# Patient Record
Sex: Female | Born: 1985 | Race: Black or African American | Hispanic: No | Marital: Single | Smoking: Never smoker
Health system: Southern US, Community
[De-identification: ages and names within clinical notes are randomized; demographics above are authoritative.]

## PROBLEM LIST (undated history)

## (undated) DIAGNOSIS — Z789 Other specified health status: Secondary | ICD-10-CM

## (undated) HISTORY — PX: NO PAST SURGERIES: SHX2092

## (undated) HISTORY — DX: Other specified health status: Z78.9

---

## 2021-05-12 NOTE — L&D Delivery Note (Signed)
Delivery Note Called to the room at 1600 as the patient was feeling stronger rectal pressure. At 1633, a viable female was delivered vaginally.via  (Presentation: Oa, with restitution to ROT     ).  The baby presented OA, then rotated to ROT. The posterior should delivered first using gentle upwasrd guidance, followed by the anterior shoulder. The perineum was intact. Spontaneous respirations noted. The infant was placed immediately on the maternal abdomen for drying and tactile stimulation.APGAR: 9, 9; weight  .pending   Placenta status:  delivered at 1641 .  Cord: 3 vessel.  with the following complications: none.    Anesthesia: epidural  Episiotomy:  none Lacerations:  none Suture Repair:  NA Est. Blood Loss (mL):  est 100  Mom to postpartum.  Baby to Couplet care / Skin to Skin.  Imagene Riches 03/04/2022, 4:59 PM

## 2021-07-29 ENCOUNTER — Emergency Department: Payer: Medicaid Other

## 2021-07-29 ENCOUNTER — Other Ambulatory Visit: Payer: Self-pay

## 2021-07-29 ENCOUNTER — Emergency Department
Admission: EM | Admit: 2021-07-29 | Discharge: 2021-07-29 | Disposition: A | Discharge status: Home / Self Care | Payer: Medicaid Other | Attending: Emergency Medicine | Admitting: Emergency Medicine

## 2021-07-29 ENCOUNTER — Encounter: Payer: Self-pay | Admitting: Emergency Medicine

## 2021-07-29 DIAGNOSIS — N9489 Other specified conditions associated with female genital organs and menstrual cycle: Secondary | ICD-10-CM | POA: Diagnosis not present

## 2021-07-29 DIAGNOSIS — Z3A01 Less than 8 weeks gestation of pregnancy: Secondary | ICD-10-CM | POA: Diagnosis not present

## 2021-07-29 DIAGNOSIS — O209 Hemorrhage in early pregnancy, unspecified: Secondary | ICD-10-CM | POA: Diagnosis present

## 2021-07-29 DIAGNOSIS — O4690 Antepartum hemorrhage, unspecified, unspecified trimester: Secondary | ICD-10-CM

## 2021-07-29 LAB — CBC WITH DIFFERENTIAL/PLATELET
Abs Immature Granulocytes: 0.01 10*3/uL (ref 0.00–0.07)
Basophils Absolute: 0 10*3/uL (ref 0.0–0.1)
Basophils Relative: 1 %
Eosinophils Absolute: 0.1 10*3/uL (ref 0.0–0.5)
Eosinophils Relative: 1 %
HCT: 34.6 % — ABNORMAL LOW (ref 36.0–46.0)
Hemoglobin: 11.2 g/dL — ABNORMAL LOW (ref 12.0–15.0)
Immature Granulocytes: 0 %
Lymphocytes Relative: 34 %
Lymphs Abs: 1.9 10*3/uL (ref 0.7–4.0)
MCH: 27.2 pg (ref 26.0–34.0)
MCHC: 32.4 g/dL (ref 30.0–36.0)
MCV: 84 fL (ref 80.0–100.0)
Monocytes Absolute: 0.3 10*3/uL (ref 0.1–1.0)
Monocytes Relative: 6 %
Neutro Abs: 3.2 10*3/uL (ref 1.7–7.7)
Neutrophils Relative %: 58 %
Platelets: 309 10*3/uL (ref 150–400)
RBC: 4.12 MIL/uL (ref 3.87–5.11)
RDW: 12.9 % (ref 11.5–15.5)
WBC: 5.6 10*3/uL (ref 4.0–10.5)
nRBC: 0 % (ref 0.0–0.2)

## 2021-07-29 LAB — ANTIBODY SCREEN: Antibody Screen: NEGATIVE

## 2021-07-29 LAB — ABO/RH: ABO/RH(D): O NEG

## 2021-07-29 LAB — COMPREHENSIVE METABOLIC PANEL
ALT: 9 U/L (ref 0–44)
AST: 12 U/L — ABNORMAL LOW (ref 15–41)
Albumin: 3.4 g/dL — ABNORMAL LOW (ref 3.5–5.0)
Alkaline Phosphatase: 59 U/L (ref 38–126)
Anion gap: 7 (ref 5–15)
BUN: 6 mg/dL (ref 6–20)
CO2: 22 mmol/L (ref 22–32)
Calcium: 8.8 mg/dL — ABNORMAL LOW (ref 8.9–10.3)
Chloride: 105 mmol/L (ref 98–111)
Creatinine, Ser: 0.55 mg/dL (ref 0.44–1.00)
GFR, Estimated: >60 mL/min (ref >60)
Glucose, Bld: 93 mg/dL (ref 70–99)
Potassium: 3.5 mmol/L (ref 3.5–5.1)
Sodium: 134 mmol/L — ABNORMAL LOW (ref 135–145)
Total Bilirubin: 0.4 mg/dL (ref 0.3–1.2)
Total Protein: 7.4 g/dL (ref 6.5–8.1)

## 2021-07-29 LAB — HCG, QUANTITATIVE, PREGNANCY: hCG, Beta Chain, Quant, S: 97430 m[IU]/mL — ABNORMAL HIGH (ref <5)

## 2021-07-29 MED ORDER — RHO D IMMUNE GLOBULIN 1500 UNIT/2ML IJ SOSY
300.0000 ug | PREFILLED_SYRINGE | Freq: Once | INTRAMUSCULAR | Status: AC
  Administered 2021-07-29: 300 ug via INTRAMUSCULAR
  Filled 2021-07-29: qty 2

## 2021-07-29 MED ORDER — ONDANSETRON 4 MG PO TBDP
4.0000 mg | ORAL_TABLET | Freq: Once | ORAL | Status: AC
  Administered 2021-07-29: 4 mg via ORAL
  Filled 2021-07-29: qty 1

## 2021-07-29 NOTE — ED Provider Notes (Signed)
? ?[facility] ?Provider Note ? ? ? Event Date/Time  ? First MD Initiated Contact with Patient 07/29/21 [phone]   ?  (approximate) ? ? ?History  ? ?Vaginal Bleeding ? ? ?HPI ? ?Caitlin Vargas is a 36 y.o. female   presents to the ED with complaint of vaginal bleeding.  Patient reports that she is 8 weeks and 5 days pregnant and has had some spotting on a daily basis with her pregnancy so far.  She states that first thing this morning she saw more blood in the toilet than normal.  She denies any abdominal pain or cramping.  Currently she department complains of nausea without vomiting.  Patient reports that she is O negative and this is documented in her office visit with her gynecologist at [facility].  She denies any pain at this time.  Patient is otherwise healthy. ? ?  ? ? ?Physical Exam  ? ?Triage Vital Signs: ?ED Triage Vitals [07/29/21 0717]  ?Enc Vitals Group  ?   BP 128/65  ?   Pulse Rate (!) 59  ?   Resp 20  ?   Temp 98.6 ?F (37 ?C)  ?   Temp Source Oral  ?   SpO2 96 %  ?   Weight 208 lb (94.3 kg)  ?   Height 5\' 2"  (1.575 m)  ?   Head Circumference   ?   Peak Flow   ?   Pain Score 0  ?   Pain Loc   ?   Pain Edu?   ?   Excl. in GC?   ? ? ?Most recent vital signs: ?Vitals:  ? 07/29/21 1035 07/29/21 1143  ?BP: 127/72 128/70  ?Pulse: 67 68  ?Resp: 18 18  ?Temp:    ?SpO2: 97% 97%  ? ? ? ?General: Awake, no distress.   ?CV:  Good peripheral perfusion.  Heart regular rate and rhythm without murmur. ?Resp:  Normal effort.  Lungs are Clear bilaterally. ?Abd:  No distention. Non-tender.  Bowel sounds normoactive x4 quadrants. ?  ? ? ?ED Results / Procedures / Treatments  ? ?Labs ?(all labs ordered are listed, but only abnormal results are displayed) ?Labs Reviewed  ?HCG, QUANTITATIVE, PREGNANCY - Abnormal; Notable for the following components:  ?    Result Value  ? hCG, Beta Chain, Quant, S 97,430 (*)   ? All other components within normal limits  ?CBC WITH DIFFERENTIAL/PLATELET - Abnormal; Notable for the  following components:  ? Hemoglobin 11.2 (*)   ? HCT 34.6 (*)   ? All other components within normal limits  ?COMPREHENSIVE METABOLIC PANEL - Abnormal; Notable for the following components:  ? Sodium 134 (*)   ? Calcium 8.8 (*)   ? Albumin 3.4 (*)   ? AST 12 (*)   ? All other components within normal limits  ?ABO/RH  ?ANTIBODY SCREEN  ?RHOGAM INJECTION  ? ? ? ?RADIOLOGY ? ?Ultrasound OB less than 14 weeks shows a single IUP at 8 weeks 4 days with a heart rate of 176.  A small subchorionic hemorrhage was noted per radiologist. ? ? ?PROCEDURES: ? ?Critical Care performed:  ? ?Procedures ? ? ?MEDICATIONS ORDERED IN ED: ?Medications  ?ondansetron (ZOFRAN-ODT) disintegrating tablet 4 mg (4 mg Oral Given 07/29/21 0743)  ?rho (d) immune globulin (RHIG/RHOPHYLAC) injection 300 mcg (300 mcg Intramuscular Given 07/29/21 1141)  ? ? ? ?IMPRESSION / MDM / ASSESSMENT AND PLAN / ED COURSE  ?I reviewed the triage vital signs and  the nursing notes. ? ? ?Differential diagnosis includes, but is not limited to, vaginal bleeding with pregnancy, threatened abortion, subchorionic hemorrhage ? ?36 year old female presents to the ED with history of spotting every day since the time that she found out that she was pregnant.  She is also done this with other pregnancies.  This morning first thing she noted more blood than usual in the toilet but since being in the emergency department she is return to her normal amount of spotting.  She denies any vaginal pain or abdominal pain.  Patient is O- and reports that she has always gotten an injection of RhoGAM while being pregnant and after delivery.  Ultrasound shows she has a single IUP at 8 weeks 4 days with a small subchorionic hemorrhage.  Patient elected to have the RhoGAM while in the ED and was made aware that she needs to call and let her OB/GYN know that she has had it.  She also is to make an appointment for follow-up in his office.   ? ? ?FINAL CLINICAL IMPRESSION(S) / ED DIAGNOSES   ? ?Final diagnoses:  ?Vaginal bleeding in pregnancy  ? ? ? ?Rx / DC Orders  ? ?ED Discharge Orders   ? ? None  ? ?  ? ? ? ?Note:  This document was prepared using Dragon voice recognition software and may include unintentional dictation errors. ?  ?Tommi Rumps, PA-C ?07/29/21 1148 ? ?  ?Jene Every, MD ?07/29/21 1157 ? ?

## 2021-07-29 NOTE — ED Notes (Signed)
See triage note  presents with some vaginal bleeding  states she has had some vaginal spotting over the past few weeks  states today the bleeding became worse   ?

## 2021-07-29 NOTE — Discharge Instructions (Addendum)
Call make a follow-up appointment with your OB/GYN.  Your ultrasound shows a single pregnancy at 8 weeks and 4 days.  There is a small subchorionic hemorrhage that we discussed which is most likely the source of your spotting.  Also let your OB/GYN know that you did get the RhoGAM while in the urgency department today.  Return if any severe worsening of your symptoms ?

## 2021-07-29 NOTE — ED Triage Notes (Signed)
Pt to ED via POV with c/o vaginal bleeding. She is 8 weeks and 5 day, she has been bleeding the entire pregnancy but this am she reports having more blood in the toilet and on the tissue. Denies any cramping ?

## 2021-08-01 LAB — RHOGAM INJECTION: Unit division: 0

## 2021-10-11 ENCOUNTER — Ambulatory Visit (INDEPENDENT_AMBULATORY_CARE_PROVIDER_SITE_OTHER): Payer: Medicaid Other | Admitting: Obstetrics

## 2021-10-11 ENCOUNTER — Other Ambulatory Visit: Payer: Self-pay | Admitting: Obstetrics

## 2021-10-11 ENCOUNTER — Encounter: Payer: Self-pay | Admitting: Obstetrics

## 2021-10-11 VITALS — BP 118/78 | HR 81 | Wt 206.1 lb

## 2021-10-11 DIAGNOSIS — Z113 Encounter for screening for infections with a predominantly sexual mode of transmission: Secondary | ICD-10-CM

## 2021-10-11 DIAGNOSIS — Z3480 Encounter for supervision of other normal pregnancy, unspecified trimester: Secondary | ICD-10-CM | POA: Diagnosis not present

## 2021-10-11 DIAGNOSIS — Z1379 Encounter for other screening for genetic and chromosomal anomalies: Secondary | ICD-10-CM

## 2021-10-11 DIAGNOSIS — Z3200 Encounter for pregnancy test, result unknown: Secondary | ICD-10-CM | POA: Diagnosis not present

## 2021-10-11 DIAGNOSIS — Z0283 Encounter for blood-alcohol and blood-drug test: Secondary | ICD-10-CM | POA: Diagnosis not present

## 2021-10-11 LAB — POCT URINE PREGNANCY: Preg Test, Ur: POSITIVE — AB

## 2021-10-11 NOTE — Progress Notes (Signed)
NEW OB TRANSFER HISTORY AND PHYSICAL  SUBJECTIVE:       Caitlin Vargas is a 36 y.o. G35P0020 female, Patient's last menstrual period was 05/29/2021 (exact date)., Estimated Date of Delivery: 03/06/22 presents today for transfer of prenatal care from the [facility]. Genetic screening was normal. Had normal anatomy US 10/02/21. She reports chronic constipation and no other concerns today.  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 Exercise: Encouraged Substance use: denies EtOH, vape, tobacco, recreational drugs   Gynecologic History Patient's last menstrual period was 05/29/2021 (exact date). Normal Contraception: none Last Pap: 05/22/2017. Results were: normal cytology, negative HPV  Obstetric History OB History  Gravida Para Term Preterm AB Living  5 2     2     SAB IAB Ectopic Multiple Live Births    2     2    # Outcome Date GA Lbr Len/2nd Weight Sex Delivery Anes PTL Lv  5 Current           4 IAB           3 IAB           2 Para           1 Para             History reviewed. No pertinent past medical history.  History reviewed. No pertinent surgical history.  Current Outpatient Medications on File Prior to Visit  Medication Sig Dispense Refill   aspirin EC 81 MG tablet Take 1 tablet by mouth daily.     Prenatal Vit-Iron Carbonyl-FA (PRENATABS RX) 29-1 MG TABS Take by mouth.     No current facility-administered medications on file prior to visit.    No Known Allergies  Social History   Socioeconomic History   Marital status: Single    Spouse name: Not on file   Number of children: Not on file   Years of education: Not on file   Highest education level: Not on file  Occupational History   Not on file  Tobacco Use   Smoking status: Never   Smokeless tobacco: Never  Substance and Sexual Activity   Alcohol use: Not Currently   Drug use:  Never   Sexual activity: Not on file  Other Topics Concern   Not on file  Social History Narrative   Not on file   Social Determinants of Health   Financial Resource Strain: Not on file  Food Insecurity: Not on file  Transportation Needs: Not on file  Physical Activity: Not on file  Stress: Not on file  Social Connections: Not on file  Intimate Partner Violence: Not on file    Family History  Family history unknown: Yes    The following portions of the patient's history were reviewed and updated as appropriate: allergies, current medications, past OB history, past medical history, past surgical history, past family history, past social history, and problem list.  History obtained from the patient General ROS: negative for - chills, fatigue, or malaise Psychological ROS: negative for - anxiety or depression Ophthalmic ROS: negative for - blurry vision ENT ROS: negative for - headaches or sore throat Hematological and Lymphatic ROS: negative for - bleeding problems, bruising, or swollen lymph nodes Endocrine ROS: negative for - malaise/lethargy, palpitations, or polydipsia/polyuria Breast ROS: negative for breast lumps Respiratory ROS: no cough, shortness of breath, or wheezing Cardiovascular ROS: no chest pain or  dyspnea on exertion Gastrointestinal ROS: no abdominal pain, change in bowel habits, or black or bloody stools positive for - constipation Genito-Urinary ROS: no dysuria, trouble voiding, or hematuria Musculoskeletal ROS: negative Dermatological ROS: negative   OBJECTIVE: Initial Physical Exam (New OB)  GENERAL APPEARANCE: alert, well appearing HEAD: normocephalic, atraumatic MOUTH: mucous membranes moist, pharynx normal without lesions THYROID: no thyromegaly or masses present BREASTS: no masses noted, no significant tenderness, no palpable axillary nodes, no skin changes LUNGS: clear to auscultation, no wheezes, rales or rhonchi, symmetric air entry HEART:  regular rate and rhythm, no murmurs ABDOMEN: soft, nontender, nondistended, no abnormal masses, no epigastric pain, FHT present (158), and fundus palpable below umbilicus EXTREMITIES: no redness or tenderness in the calves or thighs SKIN: normal coloration and turgor, no rashes LYMPH NODES: no adenopathy palpable NEUROLOGIC: alert, oriented, normal speech, no focal findings or movement disorder noted  PELVIC EXAM deferred  ASSESSMENT: Normal pregnancy [redacted]w[redacted]d    PLAN: Routine prenatal care. We discussed an overview of prenatal care and when to call. Reviewed diet, exercise, and weight gain recommendations in pregnancy. Discussed benefits of breastfeeding and lactation resources at [facility]. Discussed midwifery care. I reviewed labs and answered all questions.  See orders  Guadlupe Spanish, CNM

## 2021-10-12 LAB — URINALYSIS, ROUTINE W REFLEX MICROSCOPIC
Bilirubin, UA: NEGATIVE
Glucose, UA: NEGATIVE
Nitrite, UA: NEGATIVE
RBC, UA: NEGATIVE
Specific Gravity, UA: >=1.03 — AB (ref 1.005–1.030)
Urobilinogen, Ur: 1 mg/dL (ref 0.2–1.0)
pH, UA: 6 (ref 5.0–7.5)

## 2021-10-12 LAB — MICROSCOPIC EXAMINATION
Casts: NONE SEEN /lpf
RBC, Urine: NONE SEEN /hpf (ref 0–2)

## 2021-10-13 LAB — CULTURE, OB URINE

## 2021-10-13 LAB — URINE CULTURE, OB REFLEX

## 2021-10-15 LAB — GC/CHLAMYDIA PROBE AMP
Chlamydia trachomatis, NAA: NEGATIVE
Neisseria Gonorrhoeae by PCR: NEGATIVE

## 2021-10-15 LAB — NICOTINE SCREEN, URINE: Cotinine Ql Scrn, Ur: NEGATIVE ng/mL

## 2021-10-15 LAB — MONITOR DRUG PROFILE 14(MW)
Amphetamine Scrn, Ur: NEGATIVE ng/mL
BARBITURATE SCREEN URINE: NEGATIVE ng/mL
BENZODIAZEPINE SCREEN, URINE: NEGATIVE ng/mL
Buprenorphine, Urine: NEGATIVE ng/mL
CANNABINOIDS UR QL SCN: NEGATIVE ng/mL
Cocaine (Metab) Scrn, Ur: NEGATIVE ng/mL
Creatinine(Crt), U: 235 mg/dL (ref 20.0–300.0)
Fentanyl, Urine: NEGATIVE pg/mL
Meperidine Screen, Urine: NEGATIVE ng/mL
Methadone Screen, Urine: NEGATIVE ng/mL
OXYCODONE+OXYMORPHONE UR QL SCN: NEGATIVE ng/mL
Opiate Scrn, Ur: NEGATIVE ng/mL
Ph of Urine: 8.9 (ref 4.5–8.9)
Phencyclidine Qn, Ur: NEGATIVE ng/mL
Propoxyphene Scrn, Ur: NEGATIVE ng/mL
SPECIFIC GRAVITY: 1.03
Tramadol Screen, Urine: NEGATIVE ng/mL

## 2021-11-07 ENCOUNTER — Ambulatory Visit (INDEPENDENT_AMBULATORY_CARE_PROVIDER_SITE_OTHER): Payer: Medicaid Other | Admitting: Certified Nurse Midwife

## 2021-11-07 VITALS — BP 117/73 | HR 86 | Wt 206.0 lb

## 2021-11-07 DIAGNOSIS — Z3480 Encounter for supervision of other normal pregnancy, unspecified trimester: Secondary | ICD-10-CM

## 2021-11-07 LAB — POCT URINALYSIS DIPSTICK OB
Bilirubin, UA: NEGATIVE
Blood, UA: NEGATIVE
Glucose, UA: NEGATIVE
Ketones, UA: NEGATIVE
Leukocytes, UA: NEGATIVE
Nitrite, UA: NEGATIVE
POC,PROTEIN,UA: NEGATIVE
Spec Grav, UA: 1.02 (ref 1.010–1.025)
Urobilinogen, UA: 0.2 E.U./dL
pH, UA: 8 (ref 5.0–8.0)

## 2021-11-07 MED ORDER — ASPIRIN 81 MG PO TBEC: 81.0000 mg | DELAYED_RELEASE_TABLET | Freq: Every day | ORAL | 12 refills | Status: DC

## 2021-11-07 NOTE — Patient Instructions (Signed)
Round Ligament Pain  The round ligaments are a pair of cord-like tissues that help support the uterus. They can become a source of pain during pregnancy as the ligaments soften and stretch as the baby grows. The pain usually begins in the second trimester (13-28 weeks) of pregnancy, and should only last for a few seconds when it occurs. However, the pain can come and go until the baby is delivered. The pain does not cause harm to the baby. Round ligament pain is usually a short, sharp, and pinching pain, but it can also be a dull, lingering, and aching pain. The pain is felt in the lower side of the abdomen or in the groin. It usually starts deep in the groin and moves up to the outside of the hip area. The pain may happen when you: Suddenly change position, such as quickly going from a sitting to standing position. Do physical activity. Cough or sneeze. Follow these instructions at home: Managing pain  When the pain starts, relax. Then, try any of these methods to help with the pain: Sit down. Flex your knees up to your abdomen. Lie on your side with one pillow under your abdomen and another pillow between your legs. Sit in a warm bath for 15-20 minutes or until the pain goes away. General instructions Watch your condition for any changes. Move slowly when you sit down or stand up. Stop or reduce your physical activities if they cause pain. Avoid long walks if they cause pain. Take over-the-counter and prescription medicines only as told by your health care provider. Keep all follow-up visits. This is important. Contact a health care provider if: Your pain does not go away with treatment. You feel pain in your back that you did not have before. Your medicine is not helping. You have a fever or chills. You have nausea or vomiting. You have diarrhea. You have pain when you urinate. Get help right away if: You have pain that is a rhythmic, cramping pain similar to labor pains. Labor  pains are usually 2 minutes apart, last for about 1 minute, and involve a bearing down feeling or pressure in your pelvis. You have vaginal bleeding. These symptoms may represent a serious problem that is an emergency. Do not wait to see if the symptoms will go away. Get medical help right away. Call your local emergency services (911 in the [place] Do not drive yourself to the hospital. Summary Round ligament pain is felt in the lower abdomen or groin. This pain usually begins in the second trimester (13-28 weeks) and should only last for a few seconds when it occurs. You may notice the pain when you suddenly change position, when you cough or sneeze, or during physical activity. Relaxing, flexing your knees to your abdomen, lying on one side, or taking a warm bath may help to get rid of the pain. Contact your health care provider if the pain does not go away. This information is not intended to replace advice given to you by your health care provider. Make sure you discuss any questions you have with your health care provider. Document Revised: 07/11/2020 Document Reviewed: 07/11/2020 Elsevier Patient Education  2023 [employer] Inc[employer]  

## 2021-11-07 NOTE — Progress Notes (Signed)
ROB doing well, feeling movement. Reviewed glucose testing next visit. She verbalizes understanding . Pt request new scrpt for baby Asprin states she did not want to drive all the way to [place] to get refilled. Order placed. Follow up 4 wks for ROB and glucose screen with Missy.   Doreene Burke, CNM

## 2021-11-15 ENCOUNTER — Encounter: Payer: Self-pay | Admitting: Obstetrics

## 2021-12-09 ENCOUNTER — Ambulatory Visit (INDEPENDENT_AMBULATORY_CARE_PROVIDER_SITE_OTHER): Payer: Medicaid Other | Admitting: Obstetrics

## 2021-12-09 ENCOUNTER — Other Ambulatory Visit: Payer: Medicaid Other

## 2021-12-09 VITALS — BP 118/75 | HR 72 | Wt 205.5 lb

## 2021-12-09 DIAGNOSIS — Z2913 Encounter for prophylactic Rho(D) immune globulin: Secondary | ICD-10-CM

## 2021-12-09 DIAGNOSIS — Z23 Encounter for immunization: Secondary | ICD-10-CM | POA: Diagnosis not present

## 2021-12-09 DIAGNOSIS — Z3A27 27 weeks gestation of pregnancy: Secondary | ICD-10-CM | POA: Diagnosis not present

## 2021-12-09 DIAGNOSIS — O360120 Maternal care for anti-D [Rh] antibodies, second trimester, not applicable or unspecified: Secondary | ICD-10-CM

## 2021-12-09 LAB — POCT URINALYSIS DIPSTICK OB
Bilirubin, UA: NEGATIVE
Blood, UA: NEGATIVE
Glucose, UA: NEGATIVE
Ketones, UA: NEGATIVE
Leukocytes, UA: NEGATIVE
Nitrite, UA: NEGATIVE
POC,PROTEIN,UA: NEGATIVE
Spec Grav, UA: 1.015 (ref 1.010–1.025)
Urobilinogen, UA: 0.2 E.U./dL
pH, UA: 6 (ref 5.0–8.0)

## 2021-12-09 MED ORDER — RHO D IMMUNE GLOBULIN 1500 UNIT/2ML IJ SOSY
300.0000 ug | PREFILLED_SYRINGE | Freq: Once | INTRAMUSCULAR | Status: AC
  Administered 2021-12-09: 300 ug via INTRAMUSCULAR

## 2021-12-09 NOTE — Progress Notes (Signed)
ROB at [redacted]w[redacted]d. Active baby. Caitlin Vargas is having some hip pain. Discussed abdominal binder, chiropractor, warm baths, exercises. Discussed birth plan. She desires another unmedicated birth; plans to BF. NFP for contraception. She received TDaP and Rhogam shots today. BTC signed, RSB reviewed. Glucose, CBC, RPR today. RTC in 2 weeks.  Caitlin Vargas Spanish, CNM

## 2021-12-10 ENCOUNTER — Encounter: Payer: Self-pay | Admitting: Obstetrics

## 2021-12-10 LAB — CBC
Hematocrit: 33.3 % — ABNORMAL LOW (ref 34.0–46.6)
Hemoglobin: 11.1 g/dL (ref 11.1–15.9)
MCH: 29.8 pg (ref 26.6–33.0)
MCHC: 33.3 g/dL (ref 31.5–35.7)
MCV: 89 fL (ref 79–97)
Platelets: 301 10*3/uL (ref 150–450)
RBC: 3.73 x10E6/uL — ABNORMAL LOW (ref 3.77–5.28)
RDW: 13 % (ref 11.7–15.4)
WBC: 5.5 10*3/uL (ref 3.4–10.8)

## 2021-12-10 LAB — RPR: RPR Ser Ql: NONREACTIVE

## 2021-12-10 LAB — GLUCOSE, 1 HOUR GESTATIONAL: Gestational Diabetes Screen: 112 mg/dL (ref 70–139)

## 2021-12-23 ENCOUNTER — Encounter: Payer: Medicaid Other | Admitting: Certified Nurse Midwife

## 2021-12-30 ENCOUNTER — Encounter: Payer: Self-pay | Admitting: Certified Nurse Midwife

## 2021-12-30 ENCOUNTER — Ambulatory Visit (INDEPENDENT_AMBULATORY_CARE_PROVIDER_SITE_OTHER): Payer: Medicaid Other | Admitting: Certified Nurse Midwife

## 2021-12-30 ENCOUNTER — Other Ambulatory Visit (HOSPITAL_COMMUNITY)
Admission: RE | Admit: 2021-12-30 | Discharge: 2021-12-30 | Disposition: A | Discharge status: Home / Self Care | Payer: Medicaid Other | Source: Ambulatory Visit | Attending: Certified Nurse Midwife | Admitting: Certified Nurse Midwife

## 2021-12-30 VITALS — BP 115/74 | HR 85 | Wt 205.5 lb

## 2021-12-30 DIAGNOSIS — N898 Other specified noninflammatory disorders of vagina: Secondary | ICD-10-CM | POA: Insufficient documentation

## 2021-12-30 DIAGNOSIS — Z3A30 30 weeks gestation of pregnancy: Secondary | ICD-10-CM | POA: Diagnosis present

## 2021-12-30 DIAGNOSIS — O26893 Other specified pregnancy related conditions, third trimester: Secondary | ICD-10-CM

## 2021-12-30 NOTE — Progress Notes (Signed)
ROB doing well, feeling good movement.Concern about increased vaginal discharge. Request testing. Self swab collected.   Follow up 2 wk with Missy for ROB.   Doreene Burke, CNM

## 2021-12-30 NOTE — Patient Instructions (Signed)
[place] [facility]ian List  [facility]  [address], [place], [place] [address]  Phone: [phone]  [facility] (second location)  [address] [place], [place] [address]  Phone: [phone]  [facility] ([place]) [address], [place], [place] [address] Phone: [phone]  [facility]  [address] [place], [place] [address]  Phone: [phone] 

## 2021-12-31 ENCOUNTER — Encounter: Payer: Self-pay | Admitting: Certified Nurse Midwife

## 2021-12-31 ENCOUNTER — Other Ambulatory Visit: Payer: Self-pay | Admitting: Certified Nurse Midwife

## 2021-12-31 LAB — CERVICOVAGINAL ANCILLARY ONLY
Bacterial Vaginitis (gardnerella): POSITIVE — AB
Candida Glabrata: NEGATIVE
Candida Vaginitis: POSITIVE — AB
Comment: NEGATIVE
Comment: NEGATIVE
Comment: NEGATIVE

## 2021-12-31 MED ORDER — METRONIDAZOLE 500 MG PO TABS: 500.0000 mg | ORAL_TABLET | Freq: Two times a day (BID) | ORAL | 0 refills | Status: AC

## 2021-12-31 MED ORDER — MICONAZOLE NITRATE 2 % VA CREA: 1.0000 | TOPICAL_CREAM | Freq: Every day | VAGINAL | 0 refills | Status: AC

## 2022-01-15 ENCOUNTER — Ambulatory Visit (INDEPENDENT_AMBULATORY_CARE_PROVIDER_SITE_OTHER): Payer: Medicaid Other | Admitting: Obstetrics

## 2022-01-15 ENCOUNTER — Encounter: Payer: Self-pay | Admitting: Obstetrics

## 2022-01-15 VITALS — BP 107/68 | HR 80 | Wt 208.8 lb

## 2022-01-15 DIAGNOSIS — M25559 Pain in unspecified hip: Secondary | ICD-10-CM

## 2022-01-15 DIAGNOSIS — O26893 Other specified pregnancy related conditions, third trimester: Secondary | ICD-10-CM

## 2022-01-15 DIAGNOSIS — Z3483 Encounter for supervision of other normal pregnancy, third trimester: Secondary | ICD-10-CM

## 2022-01-15 DIAGNOSIS — Z3A32 32 weeks gestation of pregnancy: Secondary | ICD-10-CM

## 2022-01-15 LAB — POCT URINALYSIS DIPSTICK OB
Bilirubin, UA: NEGATIVE
Blood, UA: NEGATIVE
Glucose, UA: NEGATIVE
Ketones, UA: NEGATIVE
Leukocytes, UA: NEGATIVE
Nitrite, UA: NEGATIVE
POC,PROTEIN,UA: NEGATIVE
Spec Grav, UA: 1.02 (ref 1.010–1.025)
Urobilinogen, UA: 0.2 E.U./dL
pH, UA: 7.5 (ref 5.0–8.0)

## 2022-01-15 NOTE — Progress Notes (Signed)
ROB at [redacted]w[redacted]d. Baby has been active. Ziyanna denies ctx, LOF, and vaginal bleeding. She is having significant back and hip pain. Birth ball and massage help some. Referral to chiropractor sent. Meghen is feeling hungry all the time despite eating frequent meals/snacks. Discussed replacing carbs with high-protein snacks and drinking plenty of water. Reviewed s/s of PTL and when to go to the hospital. RTC in 2 weeks.  Guadlupe Spanish, CNM

## 2022-01-27 ENCOUNTER — Ambulatory Visit (INDEPENDENT_AMBULATORY_CARE_PROVIDER_SITE_OTHER): Payer: Medicaid Other | Admitting: Certified Nurse Midwife

## 2022-01-27 ENCOUNTER — Encounter: Payer: Self-pay | Admitting: Certified Nurse Midwife

## 2022-01-27 VITALS — BP 111/73 | HR 78 | Wt 206.3 lb

## 2022-01-27 DIAGNOSIS — Z3A34 34 weeks gestation of pregnancy: Secondary | ICD-10-CM

## 2022-01-27 NOTE — Progress Notes (Signed)
ROB doing well, feeling good movement. Discussed GBS testing next visit. She verbalizes and agress. She denies any issues today. Follow up 2 wks for ROB with Missy .   Philip Aspen, CNM

## 2022-01-27 NOTE — Patient Instructions (Signed)
Group B Streptococcus Infection During Pregnancy Group B Streptococcus (GBS) is a type of bacteria that is often found in healthy people. It is commonly found in the rectum, vagina, and intestines. In people who are healthy and not pregnant, the bacteria rarely cause serious illness or complications. However, women who test positive for GBS during pregnancy can pass the bacteria to the baby during childbirth. This can cause serious infection in the baby after birth. Women with GBS may also have infections during their pregnancy or soon after childbirth. The infections include urinary tract infections (UTIs) or infections of the uterus. GBS also increases a woman's risk of complications during pregnancy, such as early labor or delivery, miscarriage, or stillbirth. Routine testing for GBS is recommended for all pregnant women. What are the causes? This condition is caused by bacteria called Streptococcus agalactiae. What increases the risk? You may have a higher risk for GBS infection during pregnancy if you had one during a past pregnancy. What are the signs or symptoms? In most cases, GBS infection does not cause symptoms in pregnant women. If symptoms exist, they may include: Labor that starts before the 37th week of pregnancy. A UTI or bladder infection. This may cause a fever, frequent urination, or pain and burning during urination. Fever during labor. There can also be a rapid heartbeat in the mother or baby. Rare but serious symptoms of a GBS infection in women include: Blood infection (septicemia). This may cause fever, chills, or confusion. Lung infection (pneumonia). This may cause fever, chills, cough, rapid breathing, chest pain, or difficulty breathing. Bone, joint, skin, or soft tissue infection. How is this diagnosed? You may be screened for GBS between week 35 and week 37 of pregnancy. If you have symptoms of preterm labor, you may be screened earlier. This condition is diagnosed  based on lab test results from: A swab of fluid from the vagina and rectum. A urine sample. How is this treated? This condition is treated with antibiotic medicine. Antibiotic medicine may be given: To you when you go into labor, or as soon as your water breaks. The medicines will continue until after you give birth. If you are having a cesarean delivery, you do not need antibiotics unless your water has broken. To your baby, if he or she requires treatment. Your health care provider will check your baby to decide if he or she needs antibiotics to prevent a serious infection. Follow these instructions at home: Take over-the-counter and prescription medicines only as told by your health care provider. Take your antibiotic medicine as told by your health care provider. Do not stop taking the antibiotic even if you start to feel better. Keep all pre-birth (prenatal) visits and follow-up visits as told by your health care provider. This is important. Contact a health care provider if: You have pain or burning when you urinate. You have to urinate more often than usual. You have a fever or chills. You develop a bad-smelling vaginal discharge. Get help right away if: Your water breaks. You go into labor. You have severe pain in your abdomen. You have difficulty breathing. You have chest pain. These symptoms may represent a serious problem that is an emergency. Do not wait to see if the symptoms will go away. Get medical help right away. Call your local emergency services (911 in the [place] Do not drive yourself to the hospital. Summary GBS is a type of bacteria that is common in healthy people. During pregnancy, colonization with GBS can cause   serious complications for you or your baby. Your health care provider will screen you between 35 and 37 weeks of pregnancy to determine if you are colonized with GBS. If you are colonized with GBS during pregnancy, your health care provider will recommend  antibiotics through an IV during labor. After delivery, your baby will be evaluated for complications related to potential GBS infection and may require antibiotics to prevent a serious infection. This information is not intended to replace advice given to you by your health care provider. Make sure you discuss any questions you have with your health care provider. Document Revised: 02/28/2020 Document Reviewed: 11/22/2018 Elsevier Patient Education  2023 Elsevier Inc. [employer] of the uterus can occur throughout pregnancy, but they are not always a sign that you are in labor. You may have practice contractions called Braxton Hicks contractions. These false labor contractions are sometimes confused with true labor. What are [place] contractions? Braxton Hicks contractions are tightening movements that occur in the muscles of the uterus before labor. Unlike true labor contractions, these contractions do not result in opening (dilation) and thinning of the lowest part of the uterus (cervix). Toward the end of pregnancy (32-34 weeks), Braxton Hicks contractions can happen more often and may become stronger. These contractions are sometimes difficult to tell apart from true labor because they can be very uncomfortable. How to tell the difference between true labor and false labor True labor Contractions last 30-70 seconds. Contractions become very regular. Discomfort is usually felt in the top of the uterus, and it spreads to the lower abdomen and low back. Contractions do not go away with walking. Contractions usually become stronger and more frequent. The cervix dilates and gets thinner. False labor Contractions are usually shorter, weaker, and farther apart than true labor contractions. Contractions are usually irregular. Contractions are often felt in the front of the lower abdomen and in the groin. Contractions may go away when you walk around or change  positions while lying down. The cervix usually does not dilate or become thin. Sometimes, the only way to tell if you are in true labor is for your health care provider to look for changes in your cervix. Your health care provider will do a physical exam and may monitor your contractions. If you are in true labor, your health care provider will send you home with instructions about when to return to the hospital. You may continue to have Braxton Hicks contractions until you go into true labor. Follow these instructions at home:  Take over-the-counter and prescription medicines only as told by your health care provider. If Braxton Hicks contractions are making you uncomfortable: Change your position from lying down or resting to walking, or change from walking to resting. Sit and rest in a tub of warm water. Drink enough fluid to keep your urine pale yellow. Dehydration may cause these contractions. Do slow and deep breathing several times an hour. Keep all follow-up visits. This is important. Contact a health care provider if: You have a fever. You have continuous pain in your abdomen. Your contractions become stronger, more regular, and closer together. You pass blood-tinged mucus. Get help right away if: You have fluid leaking or gushing from your vagina. You have bright red blood coming from your vagina. Your baby is not moving inside you as much as it used to. Summary You may have practice contractions called Braxton Hicks contractions. These false labor contractions are sometimes confused with true labor. [place]  contractions are usually shorter, weaker, farther apart, and less regular than true labor contractions. True labor contractions usually become stronger, more regular, and more frequent. Manage discomfort from Braxton Hicks contractions by changing position, resting in a warm bath, practicing deep breathing, and drinking plenty of water. Keep all follow-up visits.  Contact your health care provider if your contractions become stronger, more regular, and closer together. This information is not intended to replace advice given to you by your health care provider. Make sure you discuss any questions you have with your health care provider. Document Revised: 03/05/2020 Document Reviewed: 03/05/2020 Elsevier Patient Education  2023 Elsevier Inc.  

## 2022-02-10 ENCOUNTER — Other Ambulatory Visit (HOSPITAL_COMMUNITY)
Admission: RE | Admit: 2022-02-10 | Discharge: 2022-02-10 | Disposition: A | Discharge status: Home / Self Care | Payer: Medicaid Other | Source: Ambulatory Visit | Attending: Obstetrics | Admitting: Obstetrics

## 2022-02-10 ENCOUNTER — Ambulatory Visit (INDEPENDENT_AMBULATORY_CARE_PROVIDER_SITE_OTHER): Payer: Medicaid Other | Admitting: Obstetrics

## 2022-02-10 VITALS — BP 122/70 | Wt 209.0 lb

## 2022-02-10 DIAGNOSIS — Z113 Encounter for screening for infections with a predominantly sexual mode of transmission: Secondary | ICD-10-CM | POA: Diagnosis present

## 2022-02-10 DIAGNOSIS — Z3483 Encounter for supervision of other normal pregnancy, third trimester: Secondary | ICD-10-CM

## 2022-02-10 DIAGNOSIS — Z3A36 36 weeks gestation of pregnancy: Secondary | ICD-10-CM

## 2022-02-10 DIAGNOSIS — Z3480 Encounter for supervision of other normal pregnancy, unspecified trimester: Secondary | ICD-10-CM | POA: Insufficient documentation

## 2022-02-10 NOTE — Progress Notes (Signed)
Routine Prenatal Care Visit  Subjective  Caitlin Vargas is a 36 y.o. [id number] at [redacted]w[redacted]d being seen today for ongoing prenatal care.  She is currently monitored for the following issues for this low-risk pregnancy and has Supervision of other normal pregnancy, antepartum on their problem list.  ----------------------------------------------------------------------------------- Patient reports backache.  She has named her baby Ndia (pronounced [place]). Does not plan on using any formal BC. Plans to breastfeed. Contractions: Irritability. Vag. Bleeding: None.  Movement: Present. Leaking Fluid denies.  ----------------------------------------------------------------------------------- The following portions of the patient's history were reviewed and updated as appropriate: allergies, current medications, past family history, past medical history, past social history, past surgical history and problem list. Problem list updated.  Objective  Blood pressure 122/70, weight 209 lb (94.8 kg), last menstrual period 05/29/2021. Pregravid weight 230 lb (104.3 kg) Total Weight Gain -21 lb (-9.526 kg) Urinalysis: Urine Protein    Urine Glucose    Fetal Status:     Movement: Present     General:  Alert, oriented and cooperative. Patient is in no acute distress.  Skin: Skin is warm and dry. No rash noted.   Cardiovascular: Normal heart rate noted  Respiratory: Normal respiratory effort, no problems with respiration noted  Abdomen: Soft, gravid, appropriate for gestational age. Pain/Pressure: Present     Pelvic:  Cervical exam deferred        Extremities: Normal range of motion.     Mental Status: Normal mood and affect. Normal behavior. Normal judgment and thought content.   Assessment   36 y.o. [id number] at [redacted]w[redacted]d by  03/06/2022, by Ultrasound presenting for routine prenatal visit  Plan   G5 Problems (from 10/11/21 to present)    No problems associated with this episode.       Preterm labor symptoms and  general obstetric precautions including but not limited to vaginal bleeding, contractions, leaking of fluid and fetal movement were reviewed in detail with the patient. Please refer to After Visit Summary for other counseling recommendations.  GBS and GC/CZ cultures today. Welcomes her to the new [facility] group:)  Return in about 1 week (around 02/17/2022) for return OB.  Imagene Riches, CNM  02/10/2022 9:34 AM

## 2022-02-10 NOTE — Progress Notes (Signed)
No vb. No lof. GBS and Aptima today

## 2022-02-11 LAB — CERVICOVAGINAL ANCILLARY ONLY
Chlamydia: NEGATIVE
Comment: NEGATIVE
Comment: NEGATIVE
Comment: NORMAL
Neisseria Gonorrhea: NEGATIVE
Trichomonas: NEGATIVE

## 2022-02-13 LAB — CULTURE, BETA STREP (GROUP B ONLY): Strep Gp B Culture: POSITIVE — AB

## 2022-02-19 ENCOUNTER — Ambulatory Visit (INDEPENDENT_AMBULATORY_CARE_PROVIDER_SITE_OTHER): Payer: Medicaid Other | Admitting: Obstetrics

## 2022-02-19 VITALS — BP 127/76 | HR 88 | Wt 210.0 lb

## 2022-02-19 DIAGNOSIS — Z3A37 37 weeks gestation of pregnancy: Secondary | ICD-10-CM

## 2022-02-19 DIAGNOSIS — Z3483 Encounter for supervision of other normal pregnancy, third trimester: Secondary | ICD-10-CM

## 2022-02-19 LAB — POCT URINALYSIS DIPSTICK OB
Glucose, UA: NEGATIVE
POC,PROTEIN,UA: NEGATIVE

## 2022-02-19 NOTE — Progress Notes (Signed)
ROB at [redacted]w[redacted]d. Feels ready for labor. Baby is moving a lot. Davon is having occasional contractions. Denies LOF and vaginal bleeding. Pediatrician handout given. Fetal head not yet engaged in pelvis. Encouraged abdominal support belt, squats. Discussed GBS results and treatment in labor. Desires SVE: cervix posterior, unable to reach. Reviewed when to go to the hospital. RTC in one week.  Lloyd Huger, CNM

## 2022-02-19 NOTE — Addendum Note (Signed)
Addended by: Landis Gandy on: 02/19/2022 10:34 AM   Modules accepted: Orders

## 2022-02-19 NOTE — Progress Notes (Signed)
No vb. No lof.  

## 2022-02-25 ENCOUNTER — Ambulatory Visit (INDEPENDENT_AMBULATORY_CARE_PROVIDER_SITE_OTHER): Payer: Medicaid Other | Admitting: Certified Nurse Midwife

## 2022-02-25 VITALS — BP 127/75 | HR 85 | Wt 211.6 lb

## 2022-02-25 DIAGNOSIS — Z3483 Encounter for supervision of other normal pregnancy, third trimester: Secondary | ICD-10-CM

## 2022-02-25 DIAGNOSIS — Z3A38 38 weeks gestation of pregnancy: Secondary | ICD-10-CM

## 2022-02-25 LAB — POCT URINALYSIS DIPSTICK OB
Bilirubin, UA: NEGATIVE
Blood, UA: NEGATIVE
Glucose, UA: NEGATIVE
Leukocytes, UA: NEGATIVE
Nitrite, UA: NEGATIVE
POC,PROTEIN,UA: NEGATIVE
Spec Grav, UA: 1.015 (ref 1.010–1.025)
Urobilinogen, UA: 0.2 E.U./dL
pH, UA: 6 (ref 5.0–8.0)

## 2022-02-25 NOTE — Progress Notes (Signed)
ROB doing well, is uncomfortable and feeling some contractions that ultimately have resolved. Reviewed labor precautions. SVE per pt request 1-2/50/-3 .   Follow up 1 wk for NST and ROB.   Philip Aspen, CNM

## 2022-02-25 NOTE — Patient Instructions (Signed)
Braxton Hicks Contractions  Contractions of the uterus can occur throughout pregnancy, but they are not always a sign that you are in labor. You may have practice contractions called Braxton Hicks contractions. These false labor contractions are sometimes confused with true labor. What are Braxton Hicks contractions? Braxton Hicks contractions are tightening movements that occur in the muscles of the uterus before labor. Unlike true labor contractions, these contractions do not result in opening (dilation) and thinning of the lowest part of the uterus (cervix). Toward the end of pregnancy (32-34 weeks), Braxton Hicks contractions can happen more often and may become stronger. These contractions are sometimes difficult to tell apart from true labor because they can be very uncomfortable. How to tell the difference between true labor and false labor True labor Contractions last 30-70 seconds. Contractions become very regular. Discomfort is usually felt in the top of the uterus, and it spreads to the lower abdomen and low back. Contractions do not go away with walking. Contractions usually become stronger and more frequent. The cervix dilates and gets thinner. False labor Contractions are usually shorter, weaker, and farther apart than true labor contractions. Contractions are usually irregular. Contractions are often felt in the front of the lower abdomen and in the groin. Contractions may go away when you walk around or change positions while lying down. The cervix usually does not dilate or become thin. Sometimes, the only way to tell if you are in true labor is for your health care provider to look for changes in your cervix. Your health care provider will do a physical exam and may monitor your contractions. If you are in true labor, your health care provider will send you home with instructions about when to return to the hospital. You may continue to have Braxton Hicks contractions until you  go into true labor. Follow these instructions at home:  Take over-the-counter and prescription medicines only as told by your health care provider. If Braxton Hicks contractions are making you uncomfortable: Change your position from lying down or resting to walking, or change from walking to resting. Sit and rest in a tub of warm water. Drink enough fluid to keep your urine pale yellow. Dehydration may cause these contractions. Do slow and deep breathing several times an hour. Keep all follow-up visits. This is important. Contact a health care provider if: You have a fever. You have continuous pain in your abdomen. Your contractions become stronger, more regular, and closer together. You pass blood-tinged mucus. Get help right away if: You have fluid leaking or gushing from your vagina. You have bright red blood coming from your vagina. Your baby is not moving inside you as much as it used to. Summary You may have practice contractions called Braxton Hicks contractions. These false labor contractions are sometimes confused with true labor. Braxton Hicks contractions are usually shorter, weaker, farther apart, and less regular than true labor contractions. True labor contractions usually become stronger, more regular, and more frequent. Manage discomfort from Braxton Hicks contractions by changing position, resting in a warm bath, practicing deep breathing, and drinking plenty of water. Keep all follow-up visits. Contact your health care provider if your contractions become stronger, more regular, and closer together. This information is not intended to replace advice given to you by your health care provider. Make sure you discuss any questions you have with your health care provider. Document Revised: 03/05/2020 Document Reviewed: 03/05/2020 Elsevier Patient Education  2023 Elsevier Inc.  

## 2022-02-28 ENCOUNTER — Encounter: Payer: Medicaid Other | Admitting: Licensed Practical Nurse

## 2022-03-03 ENCOUNTER — Encounter: Payer: Self-pay | Admitting: Licensed Practical Nurse

## 2022-03-03 ENCOUNTER — Ambulatory Visit (INDEPENDENT_AMBULATORY_CARE_PROVIDER_SITE_OTHER): Payer: Medicaid Other | Admitting: Licensed Practical Nurse

## 2022-03-03 ENCOUNTER — Ambulatory Visit (INDEPENDENT_AMBULATORY_CARE_PROVIDER_SITE_OTHER): Payer: Medicaid Other

## 2022-03-03 ENCOUNTER — Other Ambulatory Visit (INDEPENDENT_AMBULATORY_CARE_PROVIDER_SITE_OTHER): Payer: Medicaid Other

## 2022-03-03 ENCOUNTER — Other Ambulatory Visit: Payer: Self-pay | Admitting: Licensed Practical Nurse

## 2022-03-03 VITALS — BP 118/66 | HR 63 | Wt 215.6 lb

## 2022-03-03 DIAGNOSIS — Z3480 Encounter for supervision of other normal pregnancy, unspecified trimester: Secondary | ICD-10-CM

## 2022-03-03 DIAGNOSIS — O368190 Decreased fetal movements, unspecified trimester, not applicable or unspecified: Secondary | ICD-10-CM

## 2022-03-03 DIAGNOSIS — Z3A39 39 weeks gestation of pregnancy: Secondary | ICD-10-CM | POA: Diagnosis not present

## 2022-03-03 DIAGNOSIS — O368130 Decreased fetal movements, third trimester, not applicable or unspecified: Secondary | ICD-10-CM | POA: Diagnosis not present

## 2022-03-03 DIAGNOSIS — O288 Other abnormal findings on antenatal screening of mother: Secondary | ICD-10-CM

## 2022-03-03 DIAGNOSIS — Z3403 Encounter for supervision of normal first pregnancy, third trimester: Secondary | ICD-10-CM

## 2022-03-03 NOTE — Progress Notes (Signed)
Subjective:    Caitlin Vargas is a 36 y.o. female who presents for fetal monitoring per order from Roberto Scales, [place].    Results reviewed and discussed with patient by Weldon Inches, CNM.

## 2022-03-03 NOTE — Progress Notes (Unsigned)
ROB. Patient states that in the past week she has noticed decreased fetal movement.

## 2022-03-04 ENCOUNTER — Inpatient Hospital Stay: Payer: Medicaid Other | Admitting: Anesthesiology

## 2022-03-04 ENCOUNTER — Other Ambulatory Visit: Payer: Self-pay

## 2022-03-04 ENCOUNTER — Encounter: Payer: Self-pay | Admitting: Obstetrics and Gynecology

## 2022-03-04 ENCOUNTER — Inpatient Hospital Stay
Admission: EM | Admit: 2022-03-04 | Discharge: 2022-03-05 | DRG: 807 | Disposition: A | Discharge status: Home / Self Care | Payer: Medicaid Other | Attending: Certified Nurse Midwife | Admitting: Certified Nurse Midwife

## 2022-03-04 DIAGNOSIS — O99824 Streptococcus B carrier state complicating childbirth: Secondary | ICD-10-CM | POA: Diagnosis present

## 2022-03-04 DIAGNOSIS — O360130 Maternal care for anti-D [Rh] antibodies, third trimester, not applicable or unspecified: Secondary | ICD-10-CM

## 2022-03-04 DIAGNOSIS — Z6791 Unspecified blood type, Rh negative: Secondary | ICD-10-CM

## 2022-03-04 DIAGNOSIS — O4202 Full-term premature rupture of membranes, onset of labor within 24 hours of rupture: Secondary | ICD-10-CM

## 2022-03-04 DIAGNOSIS — Z23 Encounter for immunization: Secondary | ICD-10-CM | POA: Diagnosis not present

## 2022-03-04 DIAGNOSIS — Z3A39 39 weeks gestation of pregnancy: Secondary | ICD-10-CM | POA: Diagnosis not present

## 2022-03-04 DIAGNOSIS — O26893 Other specified pregnancy related conditions, third trimester: Secondary | ICD-10-CM | POA: Diagnosis present

## 2022-03-04 DIAGNOSIS — Z7982 Long term (current) use of aspirin: Secondary | ICD-10-CM | POA: Diagnosis not present

## 2022-03-04 DIAGNOSIS — O4290 Premature rupture of membranes, unspecified as to length of time between rupture and onset of labor, unspecified weeks of gestation: Secondary | ICD-10-CM | POA: Diagnosis present

## 2022-03-04 LAB — CBC
HCT: 35.2 % — ABNORMAL LOW (ref 36.0–46.0)
Hemoglobin: 12 g/dL (ref 12.0–15.0)
MCH: 29.8 pg (ref 26.0–34.0)
MCHC: 34.1 g/dL (ref 30.0–36.0)
MCV: 87.3 fL (ref 80.0–100.0)
Platelets: 269 10*3/uL (ref 150–400)
RBC: 4.03 MIL/uL (ref 3.87–5.11)
RDW: 13.1 % (ref 11.5–15.5)
WBC: 9.7 10*3/uL (ref 4.0–10.5)
nRBC: 0 % (ref 0.0–0.2)

## 2022-03-04 LAB — TYPE AND SCREEN
ABO/RH(D): O NEG
Antibody Screen: NEGATIVE

## 2022-03-04 MED ORDER — FENTANYL-BUPIVACAINE-NACL 0.5-0.125-0.9 MG/250ML-% EP SOLN
EPIDURAL | Status: AC
  Filled 2022-03-04: qty 250

## 2022-03-04 MED ORDER — OXYTOCIN BOLUS FROM INFUSION
333.0000 mL | Freq: Once | INTRAVENOUS | Status: AC
  Administered 2022-03-04: 333 mL via INTRAVENOUS

## 2022-03-04 MED ORDER — ZOLPIDEM TARTRATE 5 MG PO TABS: 5.0000 mg | ORAL_TABLET | Freq: Every evening | ORAL | Status: DC | PRN

## 2022-03-04 MED ORDER — ACETAMINOPHEN 325 MG PO TABS
650.0000 mg | ORAL_TABLET | ORAL | Status: DC | PRN
  Administered 2022-03-05 (×2): 650 mg via ORAL
  Filled 2022-03-04: qty 2

## 2022-03-04 MED ORDER — TERBUTALINE SULFATE 1 MG/ML IJ SOLN: 0.2500 mg | Freq: Once | INTRAMUSCULAR | Status: DC | PRN

## 2022-03-04 MED ORDER — LIDOCAINE HCL (PF) 1 % IJ SOLN: 30.0000 mL | INTRAMUSCULAR | Status: DC | PRN

## 2022-03-04 MED ORDER — SODIUM CHLORIDE 0.9 % IV SOLN
INTRAVENOUS | Status: AC
  Administered 2022-03-04: 5 10*6.[IU] via INTRAVENOUS
  Filled 2022-03-04: qty 5

## 2022-03-04 MED ORDER — DIBUCAINE (PERIANAL) 1 % EX OINT: 1.0000 | TOPICAL_OINTMENT | CUTANEOUS | Status: DC | PRN

## 2022-03-04 MED ORDER — FENTANYL-BUPIVACAINE-NACL 0.5-0.125-0.9 MG/250ML-% EP SOLN
12.0000 mL/h | EPIDURAL | Status: DC | PRN
  Administered 2022-03-04: 12 mL/h via EPIDURAL

## 2022-03-04 MED ORDER — IBUPROFEN 600 MG PO TABS
600.0000 mg | ORAL_TABLET | Freq: Four times a day (QID) | ORAL | Status: DC
  Administered 2022-03-04 – 2022-03-05 (×3): 600 mg via ORAL
  Filled 2022-03-04 (×3): qty 1

## 2022-03-04 MED ORDER — ONDANSETRON HCL 4 MG/2ML IJ SOLN: 4.0000 mg | INTRAMUSCULAR | Status: DC | PRN

## 2022-03-04 MED ORDER — ONDANSETRON HCL 4 MG/2ML IJ SOLN: 4.0000 mg | Freq: Four times a day (QID) | INTRAMUSCULAR | Status: DC | PRN

## 2022-03-04 MED ORDER — DOCUSATE SODIUM 100 MG PO CAPS
100.0000 mg | ORAL_CAPSULE | Freq: Two times a day (BID) | ORAL | Status: DC
  Administered 2022-03-05: 100 mg via ORAL
  Filled 2022-03-04: qty 1

## 2022-03-04 MED ORDER — PRENATAL MULTIVITAMIN CH
1.0000 | ORAL_TABLET | Freq: Every day | ORAL | Status: DC
  Administered 2022-03-05: 1 via ORAL
  Filled 2022-03-04: qty 1

## 2022-03-04 MED ORDER — DIPHENHYDRAMINE HCL 50 MG/ML IJ SOLN: 12.5000 mg | INTRAMUSCULAR | Status: DC | PRN

## 2022-03-04 MED ORDER — ONDANSETRON HCL 4 MG PO TABS: 4.0000 mg | ORAL_TABLET | ORAL | Status: DC | PRN

## 2022-03-04 MED ORDER — BENZOCAINE-MENTHOL 20-0.5 % EX AERO: 1.0000 | INHALATION_SPRAY | CUTANEOUS | Status: DC | PRN

## 2022-03-04 MED ORDER — LIDOCAINE-EPINEPHRINE (PF) 1.5 %-1:200000 IJ SOLN
INTRAMUSCULAR | Status: DC | PRN
  Administered 2022-03-04: 3 mL via PERINEURAL

## 2022-03-04 MED ORDER — LACTATED RINGERS IV SOLN
500.0000 mL | Freq: Once | INTRAVENOUS | Status: AC
  Administered 2022-03-04: 250 mL via INTRAVENOUS

## 2022-03-04 MED ORDER — OXYTOCIN-SODIUM CHLORIDE 30-0.9 UT/500ML-% IV SOLN
2.5000 [IU]/h | INTRAVENOUS | Status: DC
  Administered 2022-03-04: 2.5 [IU]/h via INTRAVENOUS

## 2022-03-04 MED ORDER — ACETAMINOPHEN 325 MG PO TABS: 650.0000 mg | ORAL_TABLET | ORAL | Status: DC | PRN

## 2022-03-04 MED ORDER — OXYTOCIN-SODIUM CHLORIDE 30-0.9 UT/500ML-% IV SOLN
1.0000 m[IU]/min | INTRAVENOUS | Status: DC
  Administered 2022-03-04: 2 m[IU]/min via INTRAVENOUS
  Filled 2022-03-04: qty 500

## 2022-03-04 MED ORDER — LIDOCAINE HCL (PF) 1 % IJ SOLN
INTRAMUSCULAR | Status: DC | PRN
  Administered 2022-03-04: 3 mL

## 2022-03-04 MED ORDER — BUPIVACAINE HCL (PF) 0.25 % IJ SOLN
INTRAMUSCULAR | Status: DC | PRN
  Administered 2022-03-04: 2 mL via EPIDURAL
  Administered 2022-03-04: 3 mL via EPIDURAL

## 2022-03-04 MED ORDER — PHENYLEPHRINE 80 MCG/ML (10ML) SYRINGE FOR IV PUSH (FOR BLOOD PRESSURE SUPPORT): 80.0000 ug | PREFILLED_SYRINGE | INTRAVENOUS | Status: DC | PRN

## 2022-03-04 MED ORDER — OXYCODONE HCL 5 MG PO TABS: 10.0000 mg | ORAL_TABLET | ORAL | Status: DC | PRN

## 2022-03-04 MED ORDER — LACTATED RINGERS IV SOLN
500.0000 mL | INTRAVENOUS | Status: DC | PRN
  Administered 2022-03-04 (×2): 500 mL via INTRAVENOUS

## 2022-03-04 MED ORDER — DIPHENHYDRAMINE HCL 25 MG PO CAPS: 25.0000 mg | ORAL_CAPSULE | Freq: Four times a day (QID) | ORAL | Status: DC | PRN

## 2022-03-04 MED ORDER — OXYCODONE HCL 5 MG PO TABS: 5.0000 mg | ORAL_TABLET | ORAL | Status: DC | PRN

## 2022-03-04 MED ORDER — EPHEDRINE 5 MG/ML INJ
10.0000 mg | INTRAVENOUS | Status: DC | PRN
  Administered 2022-03-04: 10 mg via INTRAVENOUS
  Filled 2022-03-04: qty 5

## 2022-03-04 MED ORDER — LACTATED RINGERS IV SOLN
INTRAVENOUS | Status: DC
  Administered 2022-03-04: 125 mL via INTRAVENOUS

## 2022-03-04 MED ORDER — PENICILLIN G POT IN DEXTROSE 60000 UNIT/ML IV SOLN
3.0000 10*6.[IU] | INTRAVENOUS | Status: DC
  Administered 2022-03-04: 3 10*6.[IU] via INTRAVENOUS
  Filled 2022-03-04 (×4): qty 50

## 2022-03-04 MED ORDER — SODIUM CHLORIDE 0.9 % IV SOLN: 5.0000 10*6.[IU] | Freq: Once | INTRAVENOUS | Status: AC

## 2022-03-04 MED ORDER — SIMETHICONE 80 MG PO CHEW: 80.0000 mg | CHEWABLE_TABLET | ORAL | Status: DC | PRN

## 2022-03-04 MED ORDER — IBUPROFEN 600 MG PO TABS: 600.0000 mg | ORAL_TABLET | Freq: Four times a day (QID) | ORAL | Status: DC | PRN

## 2022-03-04 MED ORDER — WITCH HAZEL-GLYCERIN EX PADS: 1.0000 | MEDICATED_PAD | CUTANEOUS | Status: DC | PRN

## 2022-03-04 MED ORDER — COCONUT OIL OIL: 1.0000 | TOPICAL_OIL | Status: DC | PRN

## 2022-03-04 MED ORDER — EPHEDRINE 5 MG/ML INJ: 10.0000 mg | INTRAVENOUS | Status: DC | PRN

## 2022-03-04 NOTE — OB Triage Note (Signed)
Patient is [redacted]w[redacted]d due date is 03/05/22, patient is seen primarily at [facility] [place] Patient was seen yesterday and had membranes swept and was estimated to be 3cm. Patient has had no complications during this pregnancy . Patient arrived tonight with c/o water membrane rupture at approx 0520 this morning; fluid still leaking upon entering triage room, fluid is clear.

## 2022-03-04 NOTE — Progress Notes (Signed)
Caitlin Vargas is a 36 y.o. [id number] at [redacted]w[redacted]d  who was admitted for SROM (occurred at 0520 this morning). She has received an epidural. Called to the room secondary to several FHTdecels.  Subjective: she is every comfortable. Her legs feel very heavy.  She has been able to nap for several hours. Denies any rectal pain.   Objective: BP 109/60   Pulse 87   Temp 97.7 F (36.5 C)   Resp 18   Ht 5\' 2"  (1.575 m)   Wt 97 kg   LMP 05/29/2021 (Exact Date)   BMI 39.11 kg/m  No intake/output data recorded. No intake/output data recorded.  FHT:  FHR: 140 bpm, variability: minimal ,  accelerations:  Abscent,  decelerations:  Present Several variables noted.  UC:   irregular, every 3-6 minutes, palpate mild SVE:   Dilation: 4 Effacement (%): 80 Station: -3 Exam by:: Rolly Salter CNM Suspect baby is OP per vaginal exam  Labs: Lab Results  Component Value Date   WBC 9.7 03/04/2022   HGB 12.0 03/04/2022   HCT 35.2 (L) 03/04/2022   MCV 87.3 03/04/2022   PLT 269 03/04/2022    Assessment / Plan: Admitted with SROM; little cervical change over several hours IUPC and FSE placed. Patient provided IV bolus and repositioning. Serial BPS ordered. Will begin augmentation with pitocin  Labor:  No cervical change . Pitocin ordered  Fetal Wellbeing:  Category II Pain Control:  Epidural I/D:  n/a Anticipated MOD:  NSVD Dr. Amalia Hailey updated on maternal/fetal status.close monitoring.  Imagene Riches, CNM 03/04/2022, 12:18 PM

## 2022-03-04 NOTE — Discharge Summary (Signed)
Postpartum Discharge Summary  Date of Service updated 10/25/20223     Patient Name: Caitlin Vargas DOB: 02/11/1986 MRN: [id number]  Date of admission: 03/04/2022 Delivery date:03/04/2022  Delivering provider: Imagene Riches  Date of discharge: 03/05/2022  Admitting diagnosis: Amniotic fluid leaking [O42.90] Intrauterine pregnancy: [redacted]w[redacted]d    Secondary diagnosis:  Principal Problem:   Amniotic fluid leaking Active Problems:   Postpartum care following vaginal delivery  Additional problems: none    Discharge diagnosis: Term Pregnancy Delivered                                              Post partum procedures:rhogam Augmentation: Pitocin Complications: None  Hospital course: Induction of Labor With Vaginal Delivery   36y.o. yo [id number] admitted to the hospital 03/04/2022 for induction of labor.  Indication for augmentation:  SROM with insufficient contractions .  Patient had an relatively uncomplicated labor course, with a Category 2 FHT, notable for minimal variability and episodes of repetitive variable decels  Membrane Rupture Time/Date: 5:20 AM ,03/04/2022   Delivery Method:Vaginal, Spontaneous  Episiotomy: None  Lacerations:  None  Details of delivery can be found in separate delivery note.  Patient had a postpartum course uncomplicated . Patient is discharged home 03/05/2022.  Newborn Data: Birth date:03/04/2022  Birth time:4:33 PM  Gender:Female  Living status:Living  Apgars:9 ,9  Weight:2940 g   Magnesium Sulfate received: No BMZ received: No Rhophylac:Yes MMR:No T-DaP:Given prenatally Flu: No Transfusion:No  Physical exam  Vitals:   03/04/22 2111 03/04/22 2314 03/05/22 0355 03/05/22 0815  BP: (!) 113/57 109/66 (!) 111/53 126/75  Pulse: (!) 102 84 90 66  Resp: 18 19 17 18   Temp: 98.5 F (36.9 C) 98.7 F (37.1 C) 98.4 F (36.9 C) 98.5 F (36.9 C)  TempSrc: Oral Oral Oral Oral  SpO2: 99% 99% 99% 100%  Weight:      Height:        General: alert, cooperative, and no distress Lochia: appropriate Uterine Fundus: firm Incision: N/A DVT Evaluation: No evidence of DVT seen on physical exam. No cords or calf tenderness. No significant calf/ankle edema. Labs: Lab Results  Component Value Date   WBC 18.3 (H) 03/05/2022   HGB 10.7 (L) 03/05/2022   HCT 31.9 (L) 03/05/2022   MCV 89.6 03/05/2022   PLT 246 03/05/2022      Latest Ref Rng & Units 07/29/2021    7:34 AM  CMP  Glucose 70 - 99 mg/dL 93   BUN 6 - 20 mg/dL 6   Creatinine 0.44 - 1.00 mg/dL 0.55   Sodium 135 - 145 mmol/L 134   Potassium 3.5 - 5.1 mmol/L 3.5   Chloride 98 - 111 mmol/L 105   CO2 22 - 32 mmol/L 22   Calcium 8.9 - 10.3 mg/dL 8.8   Total Protein 6.5 - 8.1 g/dL 7.4   Total Bilirubin 0.3 - 1.2 mg/dL 0.4   Alkaline Phos 38 - 126 U/L 59   AST 15 - 41 U/L 12   ALT 0 - 44 U/L 9    Edinburgh Score:    03/05/2022    3:45 AM  Edinburgh Postnatal Depression Scale Screening Tool  I have been able to laugh and see the funny side of things. 0  I have looked forward with enjoyment to things. 0  I have blamed  myself unnecessarily when things went wrong. 1  I have been anxious or worried for no good reason. 2  I have felt scared or panicky for no good reason. 1  Things have been getting on top of me. 1  I have been so unhappy that I have had difficulty sleeping. 0  I have felt sad or miserable. 0  I have been so unhappy that I have been crying. 0  The thought of harming myself has occurred to me. 0  Edinburgh Postnatal Depression Scale Total 5      After visit meds:  Allergies as of 03/05/2022   No Known Allergies      Medication List     STOP taking these medications    aspirin EC 81 MG tablet   Prenatabs Rx 29-1 MG Tabs   pyridOXINE 25 MG tablet Commonly known as: VITAMIN B6       TAKE these medications    Prenate DHA 18-0.6-0.4-300 MG Caps Take 1 capsule by mouth daily.         Discharge home in stable  condition Infant Feeding: Breast Infant Disposition:home with mother Discharge instruction: per After Visit Summary and Postpartum booklet. Activity: Advance as tolerated. Pelvic rest for 6 weeks.  Diet: routine diet Anticipated Birth Control:  natural family planning  Postpartum Appointment:6 weeks with a phone/virtual appt at 2 weeks post delivery Additional Postpartum F/U: Postpartum Depression checkup Future Appointments: 2 week video visit & 6 week ppv in office Future Appointments  Date Time Provider [place]  03/12/2022  8:15 AM AOB-[facility]ST ROOM [facility] None  03/12/2022  8:55 AM Lurlean Horns, CNM [facility] None   Follow up Visit:  Follow-up Information     Imagene Riches, CNM. Schedule an appointment as soon as possible for a visit in 6 week(s).   Specialties: Obstetrics, Gynecology Why: Please make a phone appointment for 2 weeks post delivery, and another appointment for a 6 week post delivery physical  at [facility] information: [address] [place] [address] [phone]                     03/05/2022 Philip Aspen, CNM

## 2022-03-04 NOTE — Progress Notes (Signed)
Caitlin Vargas continues in labor, now augmented with pitocin. Called to the room due to repetitive decels during a string of contractions  Subjective:   Objective: BP 129/65   Pulse 90   Temp 98.4 F (36.9 C) (Oral)   Resp 18   Ht 5\' 2"  (1.575 m)   Wt 97 kg   LMP 05/29/2021 (Exact Date)   SpO2 100%   BMI 39.11 kg/m  No intake/output data recorded. Total I/O In: -  Out: 500 [Urine:500]  FHT:  FHR: 145 bpm, variability: moderate,  accelerations:  Present,  decelerations:  Present repetitive  variable decels noted with contractions, alleviated soemwhat with position changes. UC:   regular, every 2--3.5 minutes SVE:   Dilation: 6 Effacement (%): 90 Station: -2 Exam by:: Rolly Salter CNM Cervix is now stretchy, contractions are stronger, MVU now at 260. Will decrease pitocin.  Labs: Lab Results  Component Value Date   WBC 9.7 03/04/2022   HGB 12.0 03/04/2022   HCT 35.2 (L) 03/04/2022   MCV 87.3 03/04/2022   PLT 269 03/04/2022    Assessment / Plan: Augmentation of labor, progressing well  Labor: Progressing on Pitocin,  anticipate second stage soon Fetal Wellbeing:  Category II Pain Control:  Epidural I/D:  n/a Anticipated MOD:  NSVD  Imagene Riches, CNM 03/04/2022, 3:05 PM

## 2022-03-04 NOTE — H&P (Cosign Needed Addendum)
OB History & Physical   History of Present Illness:  Chief Complaint: "water broke"  HPI:  Caitlin Vargas is a 36 y.o. P1W2585 female at [redacted]w[redacted]d dated by 8 week ultrasound.  Her pregnancy has been complicated by Rh negative state .  Last baby born 10 years ago.  She reports contractions beginning around 3 am.   She reports leakage of fluid at 5 am.   She had vaginal spotting after cervical sweep yesterday.   She reports fetal movement.    She plans to breastfeed and use non-hormonal birth control.  Total weight gain for pregnancy: -7.327 kg    Maternal Medical History:  History reviewed. No pertinent past medical history.  History reviewed. No pertinent surgical history.  No Known Allergies  Prior to Admission medications   Medication Sig Start Date End Date Taking? Authorizing Provider  aspirin EC 81 MG tablet Take 1 tablet (81 mg total) by mouth daily. Swallow whole. 11/07/21   Doreene Burke, CNM  Prenat-FeAsp-Meth-FA-DHA w/o A (PRENATE DHA) 18-0.6-0.4-300 MG CAPS Take 1 capsule by mouth daily. 01/27/22   [provider]  Prenatal Vit-Iron Carbonyl-FA (PRENATABS RX) 29-1 MG TABS Take by mouth. Patient not taking: Reported on 02/25/2022 09/16/21   [provider]  pyridOXINE (VITAMIN B6) 25 MG tablet Take by mouth. 08/19/21 08/19/22  [provider]    OB History  Gravida Para Term Preterm AB Living  6 2 2  0 3 2  SAB IAB Ectopic Multiple Live Births  1 2 0 0 2    # Outcome Date GA Lbr Len/2nd Weight Sex Delivery Anes PTL Lv  6 Current           5 Term 09/18/11   3354 g M    LIV  4 SAB 2012          3 IAB 2011          2 Term 05/12/08    F    LIV  1 IAB 2008            Prenatal care site: [facility], [facility], [facility]  Social History: She  reports that she has never smoked. She has never used smokeless tobacco. She reports that she does not currently use alcohol. She reports that she does not use drugs.  Family History: Family history is unknown by  patient.    Review of Systems:  Review of Systems  Constitutional:  Negative for chills and fever.  HENT:  Negative for congestion, ear discharge, ear pain, hearing loss, sinus pain and sore throat.   Eyes:  Negative for blurred vision and double vision.  Respiratory:  Negative for cough, shortness of breath and wheezing.   Cardiovascular:  Negative for chest pain, palpitations and leg swelling.  Gastrointestinal:  Positive for abdominal pain. Negative for blood in stool, constipation, diarrhea, heartburn, melena, nausea and vomiting.  Genitourinary:  Negative for dysuria, flank pain, frequency, hematuria and urgency.  Musculoskeletal:  Negative for back pain, joint pain and myalgias.  Skin:  Negative for itching and rash.  Neurological:  Negative for dizziness, tingling, tremors, sensory change, speech change, focal weakness, seizures, loss of consciousness, weakness and headaches.  Endo/Heme/Allergies:  Negative for environmental allergies. Does not bruise/bleed easily.  Psychiatric/Behavioral:  Negative for depression, hallucinations, memory loss, substance abuse and suicidal ideas. The patient is not nervous/anxious and does not have insomnia.      Physical Exam:  BP 125/68   Pulse 87   Temp 98.3 F (36.8 C) (  Oral)   Resp 18   Ht 5\' 2"  (1.575 m)   Wt 97 kg   LMP 05/29/2021 (Exact Date)   BMI 39.11 kg/m   Constitutional: Well nourished, well developed female in no acute distress.  HEENT: normal Skin: Warm and dry.  Cardiovascular: Regular rate and rhythm.   Extremity:  no edema   Respiratory: Clear to auscultation bilateral. Normal respiratory effort Abdomen: FHT present Back: no CVAT Neuro: DTRs 2+, Cranial nerves grossly intact Psych: Alert and Oriented x3. No memory deficits. Normal mood and affect.    Pelvic exam: per RN Earnest Conroy 3/60/-3   Pertinent Results:   Blood type/Rh O negative  Antibody screen negative  Rubella Immune  Varicella Unknown    RPR  Non-reactive  HBsAg negative  HIV negative  GC negative  Chlamydia negative  Genetic screening Not done  1 hour GTT 112  3 hour GTT NA  GBS positive on 02/10/22   Baseline FHR: 135 beats/min   Variability: moderate   Accelerations: present   Decelerations: variable Contractions: present frequency: every 2-4 Overall assessment: reassuring   No results found for: "SARSCOV2NAA"  Assessment:  Caitlin Vargas is a 36 y.o. I5O2774 female at [redacted]w[redacted]d with SROM, contractions.   Plan:  Admit to Labor & Delivery  CBC, T&S, Clrs, IVF GBS positive: penicillin prophylaxis   Fetal well-being: category I Expectant management for vaginal delivery, augment as needed with pitocin    Rod Can, CNM 03/04/2022 7:40 AM

## 2022-03-04 NOTE — Plan of Care (Signed)
Care plan complete

## 2022-03-04 NOTE — Progress Notes (Signed)
Caitlin Vargas is a 36 y.o. [id number] at [redacted]w[redacted]d by ultrasound admitted for rupture of membranes that occurred at approximately 0520 this morning. She is uncomfortable, and has been using Nitrous Oxide for the last hour. Initially she indicated she wanted an unmedicated labor and birth. Prenatal care has been with [facility] and [facility].  Subjective: She is using the Nitrous and says it is helpful. Feeling her contractions in her back primarily.   Objective: BP 125/68   Pulse 87   Temp 98.7 F (37.1 C) (Oral)   Resp 18   Ht 5\' 2"  (1.575 m)   Wt 97 kg   LMP 05/29/2021 (Exact Date)   BMI 39.11 kg/m  No intake/output data recorded. No intake/output data recorded.  FHT:  150 baseline with minimal to occasional moderate variablity; few accels,  and occasional slight dips in FHT with contractions. UC:   regular, every 2-3.5 minutes SVE:   Dilation: 4 Effacement (%): 80 Station: -3 Exam by:: [employer] CNM  Labs: Lab Results  Component Value Date   WBC 9.7 03/04/2022   HGB 12.0 03/04/2022   HCT 35.2 (L) 03/04/2022   MCV 87.3 03/04/2022   PLT 269 03/04/2022    Assessment / Plan: Spontaneous labor, progressing normally I suspect the baby is OP; Will utilize "[employer]" techniques. She is content with Nitrous for now; will anticipate a request for epidural. Position changes demonstrated for her to encourage optimal fetal positioning.  Labor: Progressing normally and will consider augmentation with pitocin  Fetal Wellbeing:  Category II Pain Control:  Nitrous Oxide I/D:  n/a Anticipated MOD:  NSVD  Imagene Riches, CNM 03/04/2022, 10:21 AM

## 2022-03-04 NOTE — Anesthesia Procedure Notes (Signed)
Epidural Patient location during procedure: OB  Staffing Performed: resident/CRNA   Preanesthetic Checklist Completed: patient identified, IV checked, site marked, risks and benefits discussed, surgical consent, monitors and equipment checked, pre-op evaluation and timeout performed  Epidural Patient position: sitting Prep: ChloraPrep Patient monitoring: heart rate, continuous pulse ox and blood pressure Approach: midline Location: L4-L5 Injection technique: LOR saline  Needle:  Needle type: Tuohy  Needle gauge: 17 G Needle length: 9 cm Needle insertion depth: 6.5 cm Catheter type: closed end flexible Catheter size: 19 Gauge Catheter at skin depth: 11.5 cm Test dose: negative and 1.5% lidocaine with Epi 1:200 K  Assessment Events: blood not aspirated, injection not painful, no injection resistance, no paresthesia and negative IV test  Additional Notes Pt tolerated well.  Atraumatic attempt x1.  States bilateral relief from contractions.

## 2022-03-05 LAB — CBC
HCT: 31.9 % — ABNORMAL LOW (ref 36.0–46.0)
Hemoglobin: 10.7 g/dL — ABNORMAL LOW (ref 12.0–15.0)
MCH: 30.1 pg (ref 26.0–34.0)
MCHC: 33.5 g/dL (ref 30.0–36.0)
MCV: 89.6 fL (ref 80.0–100.0)
Platelets: 246 10*3/uL (ref 150–400)
RBC: 3.56 MIL/uL — ABNORMAL LOW (ref 3.87–5.11)
RDW: 13.2 % (ref 11.5–15.5)
WBC: 18.3 10*3/uL — ABNORMAL HIGH (ref 4.0–10.5)
nRBC: 0 % (ref 0.0–0.2)

## 2022-03-05 LAB — RPR: RPR Ser Ql: NONREACTIVE

## 2022-03-05 LAB — FETAL SCREEN: Fetal Screen: NEGATIVE

## 2022-03-05 MED ORDER — RHO D IMMUNE GLOBULIN 1500 UNIT/2ML IJ SOSY
300.0000 ug | PREFILLED_SYRINGE | Freq: Once | INTRAMUSCULAR | Status: AC
  Administered 2022-03-05: 300 ug via INTRAVENOUS
  Filled 2022-03-05: qty 2

## 2022-03-05 MED ORDER — RHO D IMMUNE GLOBULIN 1500 UNIT/2ML IJ SOSY
300.0000 ug | PREFILLED_SYRINGE | Freq: Once | INTRAMUSCULAR | Status: DC
  Filled 2022-03-05: qty 2

## 2022-03-05 MED ORDER — VARICELLA VIRUS VACCINE LIVE 1350 PFU/0.5ML IJ SUSR
0.5000 mL | Freq: Once | INTRAMUSCULAR | Status: AC
  Administered 2022-03-05: 0.5 mL via SUBCUTANEOUS
  Filled 2022-03-05 (×2): qty 0.5

## 2022-03-05 NOTE — Anesthesia Preprocedure Evaluation (Signed)
Anesthesia Evaluation  Patient identified by MRN, date of birth, ID band Patient awake    History of Anesthesia Complications Negative for: history of anesthetic complications  Airway Mallampati: II  TM Distance: >3 FB Neck ROM: Full    Dental  (+) Teeth Intact   Pulmonary neg pulmonary ROS,           Cardiovascular negative cardio ROS       Neuro/Psych negative neurological ROS  negative psych ROS   GI/Hepatic Neg liver ROS, GERD  ,  Endo/Other  negative endocrine ROS  Renal/GU negative Renal ROS  negative genitourinary   Musculoskeletal negative musculoskeletal ROS (+)   Abdominal   Peds negative pediatric ROS (+)  Hematology   Anesthesia Other Findings   Reproductive/Obstetrics (+) Pregnancy                             Anesthesia Physical Anesthesia Plan  ASA: 2  Anesthesia Plan: Epidural   Post-op Pain Management:    Induction:   PONV Risk Score and Plan:   Airway Management Planned:   Additional Equipment:   Intra-op Plan:   Post-operative Plan:   Informed Consent: I have reviewed the patients History and Physical, chart, labs and discussed the procedure including the risks, benefits and alternatives for the proposed anesthesia with the patient or authorized representative who has indicated his/her understanding and acceptance.       Plan Discussed with: Anesthesiologist  Anesthesia Plan Comments:         Anesthesia Quick Evaluation

## 2022-03-05 NOTE — Final Progress Note (Signed)
Physician Final Progress Note  Patient ID: Caitlin Vargas MRN: [id number] DOB/AGE: 11-19-1985 36 y.o.  Admit date: 03/04/2022 Admitting provider: Rod Can, CNM Discharge date: 03/05/2022   Admission Diagnoses: labor and delivery indication for care  Discharge Diagnoses:  Principal Problem:   Amniotic fluid leaking Active Problems:   Postpartum care following vaginal delivery    Consults: None  Significant Findings/ Diagnostic Studies: none  Procedures: Rhogam   Discharge Condition: good  Disposition: Discharge disposition: 01-Home or Self Care       Diet: Regular diet  Discharge Activity: No heavy lifting, pushing, pulling with the implant side for 2 months   Allergies as of 03/05/2022   No Known Allergies      Medication List     STOP taking these medications    aspirin EC 81 MG tablet   Prenatabs Rx 29-1 MG Tabs   pyridOXINE 25 MG tablet Commonly known as: VITAMIN B6       TAKE these medications    Prenate DHA 18-0.6-0.4-300 MG Caps Take 1 capsule by mouth daily.        Follow-up Information     Imagene Riches, CNM. Schedule an appointment as soon as possible for a visit in 6 week(s).   Specialties: Obstetrics, Gynecology Why: Please make a phone appointment for 2 weeks post delivery, and another appointment for a 6 week post delivery physical  at [place] information: [place] [phone]                 Total time spent taking care of this patient: 10 minutes  Signed: Philip Aspen 03/05/2022, 4:55 PM

## 2022-03-05 NOTE — Anesthesia Postprocedure Evaluation (Signed)
Anesthesia Post Note  Patient: Scientist, research (physical sciences)  Procedure(s) Performed: AN AD HOC LABOR EPIDURAL  Patient location during evaluation: Mother Baby Anesthesia Type: Epidural Level of consciousness: awake and alert Pain management: pain level controlled Vital Signs Assessment: post-procedure vital signs reviewed and stable Respiratory status: spontaneous breathing, nonlabored ventilation and respiratory function stable Cardiovascular status: stable Postop Assessment: no headache, no backache and epidural receding Anesthetic complications: no   No notable events documented.   Last Vitals:  Vitals:   03/05/22 0355 03/05/22 0815  BP: (!) 111/53 126/75  Pulse: 90 66  Resp: 17 18  Temp: 36.9 C 36.9 C  SpO2: 99% 100%    Last Pain:  Vitals:   03/05/22 0816  TempSrc:   PainSc: [place]

## 2022-03-05 NOTE — Lactation Note (Signed)
This note was copied from a baby's chart. Lactation Consultation Note  Patient Name: Caitlin Vargas [id number] Date: 03/05/2022   Age:36 hours  Attempted check in at 1040am; mom in a lot of pain, unable to focus at this time. RN updated; working on pain meds.  Lavonia Drafts 03/05/2022, 10:42 AM

## 2022-03-06 LAB — RHOGAM INJECTION: Unit division: 0

## 2022-03-06 NOTE — Progress Notes (Signed)
Routine Prenatal Care Visit  Subjective  Caitlin Vargas is a 36 y.o. [phone] at [redacted]w[redacted]d being seen today for ongoing prenatal care.  She is currently monitored for the following issues for this low-risk pregnancy and has Supervision of other normal pregnancy, antepartum; Amniotic fluid leaking; and Postpartum care following vaginal delivery on their problem list.  ----------------------------------------------------------------------------------- Patient reports reports FM has been "less" over the last week. She does feel movement daily, the movement is "not as much" Her husband is a Administrator, he works locally so will be present for birth but may not get much time off, her mother is available for PP support. -Desire to schedule IOL as she is "done", open to sweep.  -NST started for decreased FM, fetus initially quiet then became active, CMA not able trace heart rate d/t  movement, BPP done, BPP 8/8.  Contractions: Not present. Vag. Bleeding: Bloody Show.  Movement: Present. Leaking Fluid denies.  ----------------------------------------------------------------------------------- The following portions of the patient's history were reviewed and updated as appropriate: allergies, current medications, past family history, past medical history, past social history, past surgical history and problem list. Problem list updated.  Objective  Blood pressure 118/66, pulse 63, weight 215 lb 9.6 oz (97.8 kg), last menstrual period 05/29/2021, unknown if currently breastfeeding. Pregravid weight 230 lb (104.3 kg) Total Weight Gain -14 lb 6.4 oz (-6.532 kg) Urinalysis: Urine Protein    Urine Glucose    Fetal Status: Fetal Heart Rate (bpm): 148 Fundal Height: 39 cm Movement: Present  Presentation: Vertex  General:  Alert, oriented and cooperative. Patient is in no acute distress.  Skin: Skin is warm and dry. No rash noted.   Cardiovascular: Normal heart rate noted  Respiratory: Normal respiratory effort, no  problems with respiration noted  Abdomen: Soft, gravid, appropriate for gestational age. Pain/Pressure: Absent     Pelvic:  Cervical exam performed Dilation: 2.5 Effacement (%): 60 Station: -2  Extremities: Normal range of motion.  Edema: None  Mental Status: Normal mood and affect. Normal behavior. Normal judgment and thought content.   Assessment   36 y.o. P1W2585 at [redacted]w[redacted]d by  03/06/2022, by Ultrasound presenting for routine prenatal visit  Plan   G5 Problems (from 10/11/21 to present)     No problems associated with this episode.        Term labor symptoms and general obstetric precautions including but not limited to vaginal bleeding, contractions, leaking of fluid and fetal movement were reviewed in detail with the patient. Please refer to After Visit Summary for other counseling recommendations.   Return in about 1 week (around 03/10/2022) for ROB, NST .  IOL scheduled at [place], [place]  03/06/22  10:28 AM

## 2022-03-12 ENCOUNTER — Other Ambulatory Visit: Payer: Medicaid Other

## 2022-03-12 ENCOUNTER — Encounter: Payer: Medicaid Other | Admitting: Obstetrics

## 2022-03-14 ENCOUNTER — Encounter: Payer: Self-pay | Admitting: Licensed Practical Nurse

## 2022-03-27 ENCOUNTER — Encounter: Payer: Self-pay | Admitting: Obstetrics

## 2022-03-27 ENCOUNTER — Telehealth (INDEPENDENT_AMBULATORY_CARE_PROVIDER_SITE_OTHER): Payer: Medicaid Other | Admitting: Obstetrics

## 2022-03-27 DIAGNOSIS — Z3480 Encounter for supervision of other normal pregnancy, unspecified trimester: Secondary | ICD-10-CM

## 2022-03-27 NOTE — Progress Notes (Signed)
Postpartum Visit  Chief Complaint:  Chief Complaint  Patient presents with   Follow-up    2wk PP    History of Present Illness: Patient is a 36 y.o. [id number] presents for postpartum visit.  Date of delivery: 03/04/2022 Type of delivery: Vaginal delivery - Vacuum or forceps assisted  no Episiotomy No.  Laceration: no  Pregnancy or labor problems:  no Any problems since the delivery:  no  Newborn Details:  SINGLETON :  1. Baby's name: girl. Birth weight: 2940 gms Maternal Details:  Breast Feeding:  yes Post partum depression/anxiety noted:  no Edinburgh Post-Partum Depression Score:  0  Date of last PAP: per chart,last pap in 2019  normal this was done at [facility].   Past Medical History:  Diagnosis Date   No pertinent past medical history     Past Surgical History:  Procedure Laterality Date   NO PAST SURGERIES      Prior to Admission medications   Medication Sig Start Date End Date Taking? Authorizing Provider  Prenat-FeAsp-Meth-FA-DHA w/o A (PRENATE DHA) 18-0.6-0.4-300 MG CAPS Take 1 capsule by mouth daily. 01/27/22  Yes [provider]    No Known Allergies   Social History   Socioeconomic History   Marital status: Single    Spouse name: Not on file   Number of children: Not on file   Years of education: Not on file   Highest education level: Not on file  Occupational History   Not on file  Tobacco Use   Smoking status: Never   Smokeless tobacco: Never  Vaping Use   Vaping Use: Never used  Substance and Sexual Activity   Alcohol use: Not Currently   Drug use: Never   Sexual activity: Not on file  Other Topics Concern   Not on file  Social History Narrative   Not on file   Social Determinants of Health   Financial Resource Strain: Not on file  Food Insecurity: No Food Insecurity (03/04/2022)   Hunger Vital Sign    Worried About Running Out of Food in the Last Year: Never true    Ran Out of Food in the Last Year: Never true   Transportation Needs: No Transportation Needs (03/04/2022)   PRAPARE - Hydrologist (Medical): No    Lack of Transportation (Non-Medical): No  Physical Activity: Not on file  Stress: Not on file  Social Connections: Not on file  Intimate Partner Violence: Not At Risk (03/04/2022)   Humiliation, Afraid, Rape, and Kick questionnaire    Fear of Current or Ex-Partner: No    Emotionally Abused: No    Physically Abused: No    Sexually Abused: No    Family History  Family history unknown: Yes    Review of Systems  Constitutional: Negative.   Eyes: Negative.   Respiratory: Negative.    Cardiovascular: Negative.   Gastrointestinal: Negative.   Genitourinary: Negative.   Musculoskeletal: Negative.   Endo/Heme/Allergies: Negative.   Psychiatric/Behavioral: Negative.    All other systems reviewed and are negative.    Physical Exam Ht 5\' 2"  (1.575 m)   Wt 202 lb (91.6 kg)   Breastfeeding Yes   BMI 36.95 kg/m   OBGyn Exam   Female Chaperone present during breast and/or pelvic exam.  Assessment: 37 y.o. [id number] presenting for 2 week postpartum visit that is conducted via phone.  She needs a Pap smear1    Routine Prenatal Care Visit- Virtual Visit  Subjective  Virtual Visit via Telephone Note  I connected withNAME@ on 03/27/22 at  3:35 PM EST by telephone and verified that I am speaking with the correct person using two identifiers.   I discussed the limitations, risks, security and privacy concerns of performing an evaluation and management service by telephone and the availability of in person appointments. I also discussed with the patient that there may be a patient responsible charge related to this service. The patient expressed understanding and agreed to proceed.  The patient was at home I spoke with the patient from my  office The names of people involved in this encounter were: Hinley Gosch  , and Paula Compton CNM    ----------------------------------------------------------------------------------- The following portions of the patient's history were reviewed and updated as appropriate: allergies, current medications, past family history, past medical history, past social history, past surgical history and problem list. Problem list updated.   Subjective: Analis reports that she is doing "great". Her baby is easy; nurse, and sleeps. Breastfeeding is going very well. She denies any heavy bleeding, pelvic pain, dysuria or mood issues. She has not formally decided on birth control; she is likely finished having children. In the past she has used a non hormonal IUD. She may consider a Mirena.  Objective  Height 5\' 2"  (1.575 m), weight 202 lb (91.6 kg), currently breastfeeding. Edinburgh score - see flowsheet      Physical Exam could not be performed. Because this visit was performed over the phone and not in person.   Assessment   36 y.o. 31 at Unknown by  Not found. presenting for  a two   week postpartum visit Doing well  Plan   Problem List     No episode was linked to this visit.        Return in about 4 weeks (around 04/24/2022) for 6 week postpartum physical and contraception.  1) Contraception Education given regarding options for contraception, including IUD placement. I have snet her information on this method. She is instructed to tell the office that she will have an IUD placement when she makes her 6 week PP physical appointment.  2)  Pap - ASCCP guidelines and rational discussed.  She needs a Pap smear done at her 6 week PP appointment --  3) Patient underwent screening for postpartum depression with no concerns noted.  4) Follow up in 4 weeks for her PP exam. 04/26/2022, CNM  03/27/2022 5:04 PM   03/27/2022 5:01 PM

## 2022-03-31 ENCOUNTER — Telehealth: Payer: Self-pay

## 2022-03-31 NOTE — Telephone Encounter (Signed)
Patient is calling to see if her FMLA forms have been completed. She said she dropped them off last week and paid her fee. Please call patient when and if forms are ready for pickup.

## 2022-04-07 NOTE — Telephone Encounter (Signed)
Patient calling in again to check status of FMLA forms. Please call patient with update

## 2022-04-07 NOTE — Telephone Encounter (Signed)
See msg

## 2022-04-11 ENCOUNTER — Telehealth: Payer: Self-pay

## 2022-04-11 NOTE — Telephone Encounter (Signed)
Pt states her job will not accept her FMLA states it is not completed, She states her job needs this done ASAP. Please call pt and advise. MMF pt

## 2022-04-24 ENCOUNTER — Other Ambulatory Visit (HOSPITAL_COMMUNITY)
Admission: RE | Admit: 2022-04-24 | Discharge: 2022-04-24 | Disposition: A | Discharge status: Home / Self Care | Payer: Medicaid Other | Source: Ambulatory Visit | Attending: Licensed Practical Nurse | Admitting: Licensed Practical Nurse

## 2022-04-24 ENCOUNTER — Ambulatory Visit (INDEPENDENT_AMBULATORY_CARE_PROVIDER_SITE_OTHER): Payer: Medicaid Other | Admitting: Licensed Practical Nurse

## 2022-04-24 DIAGNOSIS — Z308 Encounter for other contraceptive management: Secondary | ICD-10-CM | POA: Diagnosis present

## 2022-04-24 DIAGNOSIS — Z1332 Encounter for screening for maternal depression: Secondary | ICD-10-CM

## 2022-04-24 DIAGNOSIS — Z30430 Encounter for insertion of intrauterine contraceptive device: Secondary | ICD-10-CM

## 2022-04-24 DIAGNOSIS — Z01419 Encounter for gynecological examination (general) (routine) without abnormal findings: Secondary | ICD-10-CM | POA: Insufficient documentation

## 2022-04-24 DIAGNOSIS — Z124 Encounter for screening for malignant neoplasm of cervix: Secondary | ICD-10-CM | POA: Insufficient documentation

## 2022-04-24 DIAGNOSIS — Z1151 Encounter for screening for human papillomavirus (HPV): Secondary | ICD-10-CM | POA: Diagnosis not present

## 2022-04-24 DIAGNOSIS — Z3040 Encounter for surveillance of contraceptives, unspecified: Secondary | ICD-10-CM

## 2022-04-24 MED ORDER — LEVONORGESTREL 20 MCG/DAY IU IUD
1.0000 | INTRAUTERINE_SYSTEM | Freq: Once | INTRAUTERINE | Status: AC
  Administered 2022-04-24: 1 via INTRAUTERINE

## 2022-04-24 NOTE — Progress Notes (Signed)
Postpartum Visit  Chief Complaint:  Chief Complaint  Patient presents with   Postpartum Care    History of Present Illness: Patient is a 36 y.o. [id number] presents for postpartum visit. Desires IUD today   Date of delivery: 03/04/2022 Type of delivery: Vaginal delivery - Vacuum or forceps assisted  no Episiotomy No.  Laceration: no  Pregnancy or labor problems:  no Any problems since the delivery:  No Bleeding stopped 2-3 weeks ago No concerns for voiding or stooling Has not had IC, desires Mirena  IUD, used ParaGard in the  past but did not like the increased bleeding.  Returns to work in 3 weeks, feels a little sad to go  back  Mood has been good  Dental has not seen in a while Wears glasses, has eye exam scheduled for tomorrow   Newborn Details:  SINGLETON :  1. Baby's name: female. Birth weight: 2940grams Maternal Details:  Breast Feeding:  yes Post partum depression/anxiety noted:  no Edinburgh Post-Partum Depression Score:  2  Date of last PAP: 2019  normal at [facility]   Past Medical History:  Diagnosis Date   No pertinent past medical history     Past Surgical History:  Procedure Laterality Date   NO PAST SURGERIES      Prior to Admission medications   Medication Sig Start Date End Date Taking? Authorizing Provider  Prenat-FeAsp-Meth-FA-DHA w/o A (PRENATE DHA) 18-0.6-0.4-300 MG CAPS Take 1 capsule by mouth daily. 01/27/22  Yes [provider]    No Known Allergies   Social History   Socioeconomic History   Marital status: Single    Spouse name: Not on file   Number of children: Not on file   Years of education: Not on file   Highest education level: Not on file  Occupational History   Not on file  Tobacco Use   Smoking status: Never   Smokeless tobacco: Never  Vaping Use   Vaping Use: Never used  Substance and Sexual Activity   Alcohol use: Not Currently   Drug use: Never   Sexual activity: Not on file  Other Topics Concern   Not on  file  Social History Narrative   Not on file   Social Determinants of Health   Financial Resource Strain: Not on file  Food Insecurity: No Food Insecurity (03/04/2022)   Hunger Vital Sign    Worried About Running Out of Food in the Last Year: Never true    Ran Out of Food in the Last Year: Never true  Transportation Needs: No Transportation Needs (03/04/2022)   PRAPARE - Administrator, Civil Service (Medical): No    Lack of Transportation (Non-Medical): No  Physical Activity: Not on file  Stress: Not on file  Social Connections: Not on file  Intimate Partner Violence: Not At Risk (03/04/2022)   Humiliation, Afraid, Rape, and Kick questionnaire    Fear of Current or Ex-Partner: No    Emotionally Abused: No    Physically Abused: No    Sexually Abused: No    Family History  Family history unknown: Yes    Review of Systems  Constitutional: Negative.   Eyes: Negative.   Respiratory: Negative.    Cardiovascular: Negative.   Gastrointestinal: Negative.   Genitourinary: Negative.   Musculoskeletal: Negative.   Neurological: Negative.   Psychiatric/Behavioral: Negative.       Physical Exam BP 137/74   Pulse 62   Wt 205 lb 11.2 oz (93.3 kg)   Breastfeeding  Yes   BMI 37.62 kg/m   Physical Exam Constitutional:      Appearance: Normal appearance.  Genitourinary:     Vulva normal.     Genitourinary Comments: Cervix pink, no lesions Uterus non gravid, no masses non tender, adnexa non tender no masses   Cardiovascular:     Rate and Rhythm: Normal rate and regular rhythm.     Pulses: Normal pulses.     Heart sounds: Normal heart sounds.  Pulmonary:     Effort: Pulmonary effort is normal.     Breath sounds: Normal breath sounds.  Chest:     Comments: Breasts; lactating, no discoloration or masses, nipples intact bilaterally  Abdominal:     General: There is no distension.     Tenderness: There is no abdominal tenderness.  Musculoskeletal:     Cervical  back: Normal range of motion and neck supple.  Neurological:     Mental Status: She is alert.  Skin:    General: Skin is warm.  Psychiatric:        Mood and Affect: Mood normal.      Female Chaperone present during breast and/or pelvic exam.  GYNECOLOGY OFFICE PROCEDURE NOTE  Dara Beidleman is a 37 y.o. [id number] here for Mirena  IUD insertion. No GYN concerns.  Last pap smear was on 2019 and was normal.  The patient is currently using PP for contraception and her LMP is No LMP recorded..  The indication for her IUD is contraception/cycle control.  IUD Insertion Procedure Note Patient identified, informed consent performed, consent signed.   Discussed risks of irregular bleeding, cramping, infection, malpositioning, expulsion or uterine perforation of the IUD (1:1000 placements)  which may require further procedure such as laparoscopy.  IUD while effective at preventing pregnancy do not prevent transmission of sexually transmitted diseases and use of barrier methods for this purpose was discussed. Time out was performed.  Urine pregnancy test negative.  Speculum placed in the vagina.  Cervix visualized.  Cleaned with Betadine x 2.  Uterus sounded to 6 cm. IUD placed per manufacturer's recommendations.  Strings trimmed to 3 cm. Tenaculum was removed, good hemostasis noted.  Patient tolerated procedure well.   Patient was given post-procedure instructions.  She was advised to have backup contraception for one week.  Patient was also asked to check IUD strings periodically and follow up PRN  Carie Caddy, CNM   [facility]  Assessment: 36 y.o. [phone] presenting for 6 week postpartum visit  Plan: Problem List Items Addressed This Visit   None Visit Diagnoses     Care and examination of lactating mother    -  Primary   Relevant Orders   Cytology - PAP   Cervical cancer screening       Relevant Orders   Cytology - PAP   Encounter for surveillance of contraceptive device        Relevant Medications   levonorgestrel (MIRENA) 20 MCG/DAY IUD 1 each (Completed)   Encounter for IUD insertion       Encounter for screening for maternal depression            1) Contraception Education IUD inserted see note .  2)  Pap - ASCCP guidelines and rational discussed.  Patient opts for 5 year  screening interval. Collected today   3) Patient underwent screening for postpartum depression with No  concerns noted.  4) Follow up 1 year for routine annual exam  Carie Caddy, CNM  [facility]  04/26/22  9:47 AM

## 2022-04-25 ENCOUNTER — Encounter: Payer: Self-pay | Admitting: Licensed Practical Nurse

## 2022-05-09 LAB — CYTOLOGY - PAP
Chlamydia: NEGATIVE
Comment: NEGATIVE
Comment: NEGATIVE
Comment: NORMAL
Diagnosis: NEGATIVE
Diagnosis: REACTIVE
High risk HPV: NEGATIVE
Neisseria Gonorrhea: NEGATIVE

## 2022-07-22 ENCOUNTER — Other Ambulatory Visit: Payer: Self-pay

## 2022-07-22 ENCOUNTER — Emergency Department
Admission: EM | Admit: 2022-07-22 | Discharge: 2022-07-22 | Disposition: A | Discharge status: Home / Self Care | Payer: BC Managed Care – PPO | Attending: Student in an Organized Health Care Education/Training Program | Admitting: Student in an Organized Health Care Education/Training Program

## 2022-07-22 ENCOUNTER — Encounter: Payer: Self-pay | Admitting: Emergency Medicine

## 2022-07-22 DIAGNOSIS — K029 Dental caries, unspecified: Secondary | ICD-10-CM | POA: Diagnosis not present

## 2022-07-22 DIAGNOSIS — K089 Disorder of teeth and supporting structures, unspecified: Secondary | ICD-10-CM | POA: Diagnosis present

## 2022-07-22 MED ORDER — AMOXICILLIN 875 MG PO TABS: 875.0000 mg | ORAL_TABLET | Freq: Two times a day (BID) | ORAL | 0 refills | Status: AC

## 2022-07-22 NOTE — ED Triage Notes (Signed)
Pt presents to the ED due to dental and ear pain. Pt states the pain started yesterday. Pt states she has not been to the dentist in awhile. Pt states it started with dental pain and radiates to her ea. Pt denies fevers and NVD. Pt A&Ox4. Pt NAD

## 2022-07-22 NOTE — Discharge Instructions (Signed)
Begin taking antibiotic until completely finished.  Because you are breast-feeding you can take limited amounts of Tylenol if needed for pain.  A list of dental clinics is on your discharge papers.  Most likely you need to see an oral surgeon and this would be through [place] [email or web address] [phone])  [facility] [phone])  [place] [phone])  [place] [phone])  [facility] [phone]) This clinic caters to the indigent population and is on a lottery system. Location: [facility], [place], [place], [place] Clinic Hours: Wednesdays from 6pm - 9pm, patients seen by a lottery system. For dates, call or go to [email or web address] Services: Cleanings, fillings and simple extractions. Payment Options: DENTAL WORK IS FREE OF CHARGE. Bring proof of income or support. Best way to get seen: Arrive at 5:15 pm - this is a lottery, NOT first come/first serve, so arriving earlier will not increase your chances of being seen.     [facility] [phone] Select option 1 for emergencies   Location: [facility], [place], [address], [place] Clinic Hours: No walk-ins accepted - call the day before to schedule an appointment. Check in times are 9:30 am and 1:30 pm. Services: Simple extractions, temporary fillings, pulpectomy/pulp debridement, uncomplicated abscess drainage. Payment Options: PAYMENT IS DUE AT THE TIME OF SERVICE.  Fee is usually $100-200, additional surgical procedures (e.g. abscess drainage) may be extra. Cash, checks, Visa/MasterCard accepted.  Can file Medicaid if patient is covered for dental - patient  should call case worker to check. No discount for [facility] patients. Best way to get seen: MUST call the day before and get onto the schedule. Can usually be seen the next 1-2 days. No walk-ins accepted.     [place] [phone]   Location: [place], [place] Clinic Hours: M, W, Th, F 8am or 1:30pm, Tues 9a or 1:30 - first come/first served. Services: Simple extractions, temporary fillings, uncomplicated abscess drainage.  You do not need to be an [facility] resident. Payment Options: PAYMENT IS DUE AT THE TIME OF SERVICE. Dental insurance, otherwise sliding scale - bring proof of income or support. Depending on income and treatment needed, cost is usually $50-200. Best way to get seen: Arrive early as it is first come/first served.     [facility] [phone]   Location: [place] [facility] Hours: Mon-Thu 8a-5p Services: Most basic dental services including extractions and fillings. Payment Options: PAYMENT IS DUE AT THE TIME OF SERVICE. Sliding scale, up to 50% off - bring proof if income or support. Medicaid with dental option accepted. Best way to get seen: Call to schedule an appointment, can usually be seen within 2 weeks OR they will try to see walk-ins - show up at [place] or 2p (you may have to wait).     [facility] [place] RESIDENTS ONLY   Location: [facility], [place] [address] [place], [place] [address] Clinic Hours: By appointment only. Monday - Thursday 8am-5pm, Friday 8am-12pm Services: Cleanings, fillings, extractions. Payment Options: PAYMENT IS DUE AT THE TIME OF SERVICE. Cash, Visa or MasterCard. Sliding scale - $30 minimum per service. Best way to get seen: Come in  to office, complete packet and make an appointment - need proof of income or support monies for each household member  and proof of [facility] residence. Usually takes about a month to get in.     [facility] [phone]   Location: [address], [place] Clinic Hours: Walk-in Urgent Care Dental Services are offered Monday-Friday mornings only. The numbers of emergencies accepted daily is limited to the number of providers available. Maximum 15 - Mondays, Wednesdays & Thursdays Maximum 10 - Tuesdays & Fridays Services: You do not need to be a [facility] resident to be seen for a dental emergency. Emergencies are defined as pain, swelling, abnormal bleeding, or dental trauma. Walkins will receive x-rays if needed. NOTE: Dental cleaning is not an emergency. Payment Options: PAYMENT IS DUE AT THE TIME OF SERVICE. Minimum co-pay is $40.00 for uninsured patients. Minimum co-pay is $3.00 for Medicaid with dental coverage. Dental Insurance is accepted and must be presented at time of visit. Medicare does not cover dental. Forms of payment: Cash, credit card, checks. Best way to get seen: If not previously registered with the clinic, walk-in dental registration begins at 7:15 am and is on a first come/first serve basis. If previously registered with the clinic, call to make an appointment.     [facility] [place] ONLY   Location: [address] [place], [place] Clinic Hours: Mon-Thu 10a-2p Services: Extractions only! Payment Options: FREE (donations accepted) - bring proof of income or support Best way to get seen: Call and schedule an appointment OR come at 8am on the 1st Monday of every month (except for holidays) when it is first come/first served.     [facility] [phone]   Location: [place], [place] Clinic Hours: Friday mornings Services, Payment Options, Best way to get seen: Call for info

## 2022-07-22 NOTE — ED Notes (Signed)
See triage note  Presents with dental pain  States she broke a tooth on right lower gum line  States pain is moving into right ear  Low grade temp on arrival

## 2022-07-22 NOTE — ED Provider Notes (Signed)
[facility] Provider Note    Event Date/Time   First MD Initiated Contact with Patient 07/22/22 0848     (approximate)   History   Dental Pain   HPI  Caitlin Vargas is a 37 y.o. female   presents to the ED with complaint of dental pain that started yesterday.  Patient states that she broke off her tooth a long time ago but is now affecting the right side of her face and causing her right ear to hurt.  Patient is currently breast-feeding.      Physical Exam   Triage Vital Signs: ED Triage Vitals  Enc Vitals Group     BP 07/22/22 0844 (!) 171/78     Pulse Rate 07/22/22 0844 65     Resp 07/22/22 0844 18     Temp 07/22/22 0844 99.2 F (37.3 C)     Temp Source 07/22/22 0844 Oral     SpO2 07/22/22 0844 99 %     Weight 07/22/22 0843 205 lb 11 oz (93.3 kg)     Height 07/22/22 0843 '5\' 2"'$  (1.575 m)     Head Circumference --      Peak Flow --      Pain Score 07/22/22 0843 10     Pain Loc --      Pain Edu? --      Excl. in [place]? --     Most recent vital signs: Vitals:   07/22/22 0844  BP: (!) 171/78  Pulse: 65  Resp: 18  Temp: 99.2 F (37.3 C)  SpO2: 99%     General: Awake, no distress.  CV:  Good peripheral perfusion.  Resp:  Normal effort.  Abd:  No distention.  Other:  Right lower posterior molar with large cavity present and dentin exposed.  No active drainage at present.  Gum is edematous surrounding the tooth.  Right EAC and TM are clear.  Neck is supple without cervical lymphadenopathy.   ED Results / Procedures / Treatments   Labs (all labs ordered are listed, but only abnormal results are displayed) Labs Reviewed - No data to display      PROCEDURES:  Critical Care performed:   Procedures   MEDICATIONS ORDERED IN ED: Medications - No data to display   IMPRESSION / MDM / [place] / ED COURSE  I reviewed the triage vital signs and the nursing notes.   Differential diagnosis includes, but is not limited  to, dental pain, dental abscess, dental carry, right otitis media, right otitis externa.  37 year old female presents to the ED with dental pain.  Exam shows chronic appearing dental carry that is now infected.  Most likely will also require an oral surgeon to remove as there is minimal tooth above the surface of the gum.  A prescription for amoxicillin 875 twice daily was sent to the pharmacy and patient is encouraged to take Tylenol as she is currently breast-feeding.  A list of dental clinics was given to her with emphasis on the [facility].      Patient's presentation is most consistent with acute complicated illness / injury requiring diagnostic workup.  FINAL CLINICAL IMPRESSION(S) / ED DIAGNOSES   Final diagnoses:  Infected dental caries     Rx / DC Orders   ED Discharge Orders          Ordered    amoxicillin (AMOXIL) 875 MG tablet  2 times daily        07/22/22  0857             Note:  This document was prepared using Dragon voice recognition software and may include unintentional dictation errors.   Johnn Hai, PA-C 07/22/22 [id number]    Merlyn Lot, MD 07/22/22 1224

## 2023-06-13 IMAGING — US US OB COMP LESS 14 WK
1 series · 15 of 28 positions shown · non-contrast
Comparison: None.

CLINICAL DATA: Vaginal bleeding in 1st trimester pregnancy.

EXAM:
OBSTETRIC <14 WK ULTRASOUND
TECHNIQUE: Transabdominal ultrasound was performed for evaluation of the
gestation as well as the maternal uterus and adnexal regions.

[Series 1: us ob comp less 14 wks · 15 of 84 slices shown]
[im 1/84]
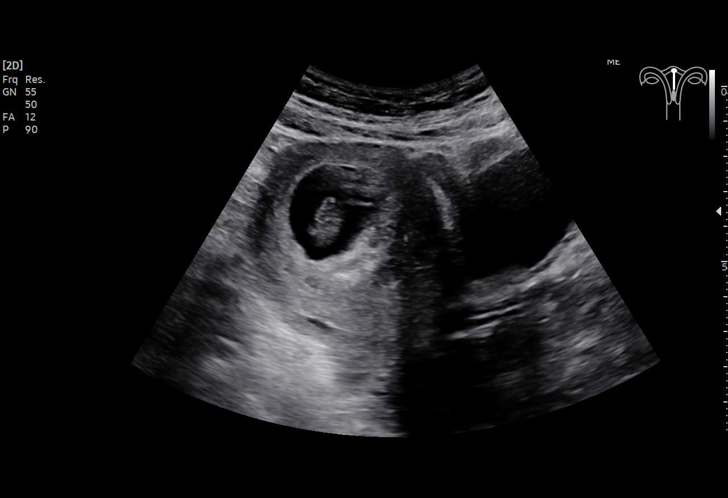
[im 7/84]
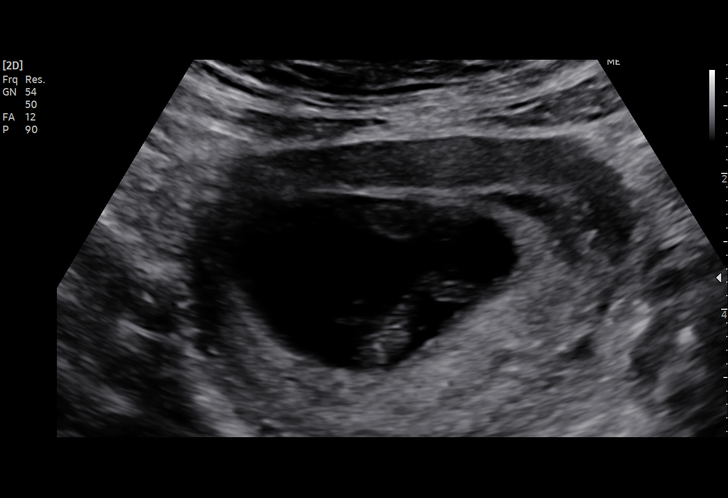
[im 13/84]
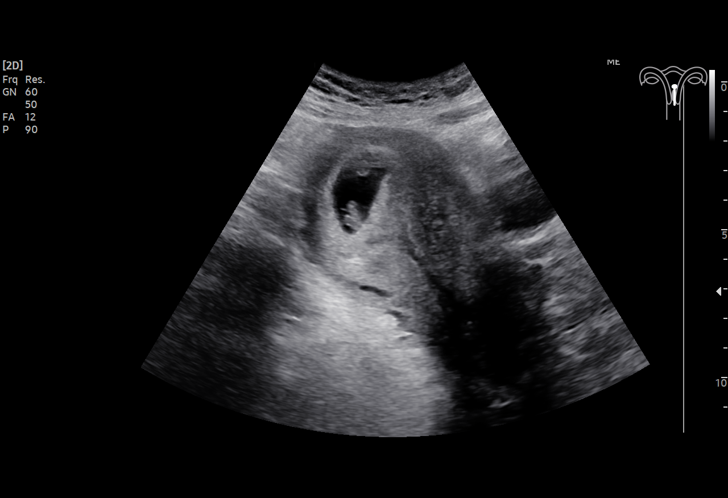
[im 19/84]
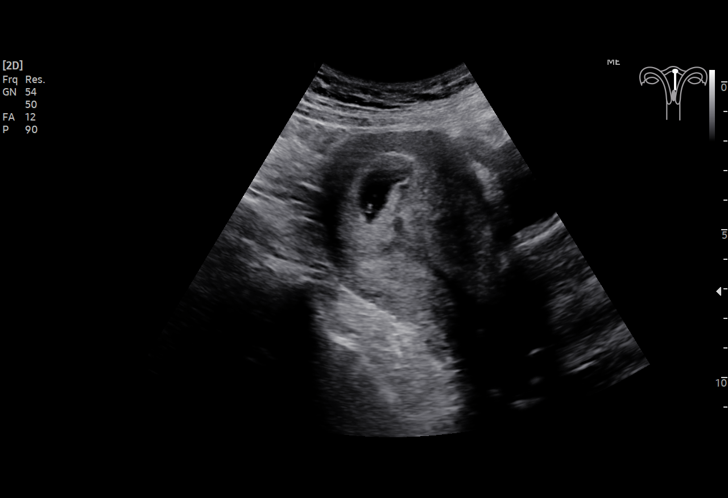
[im 25/84]
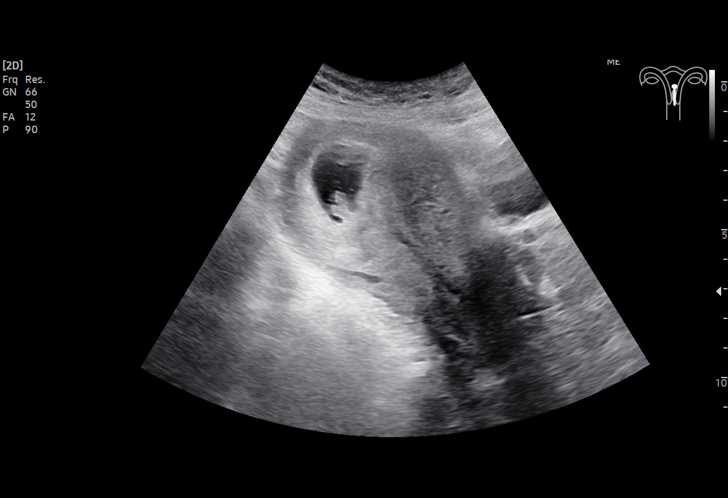
[im 31/84]
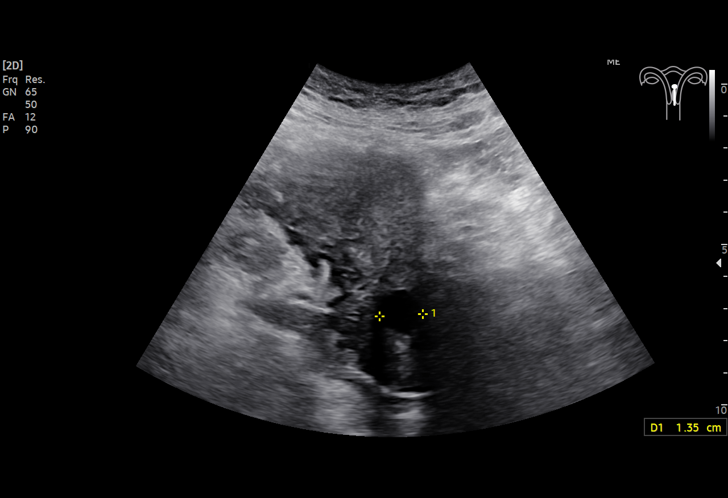
[im 37/84]
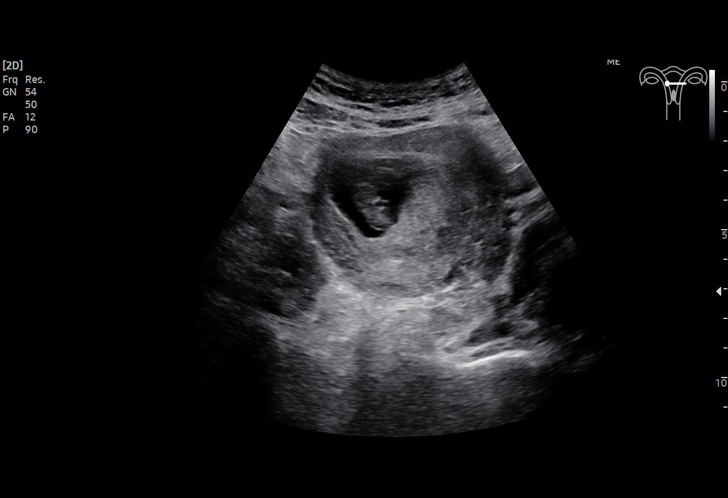
[im 44/84]
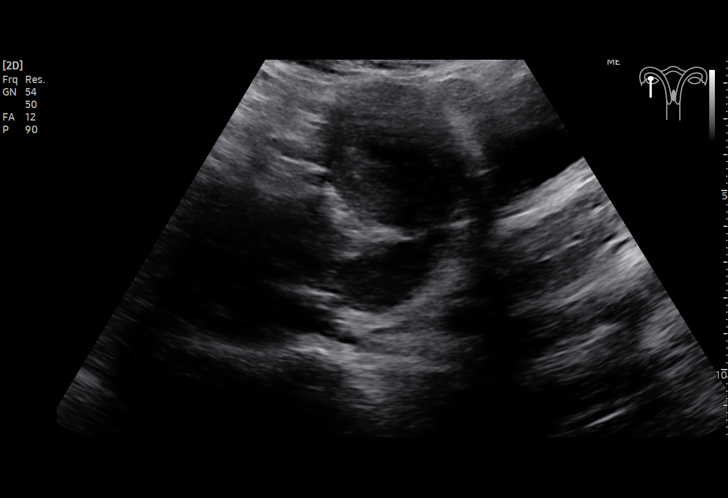
[im 47/84]
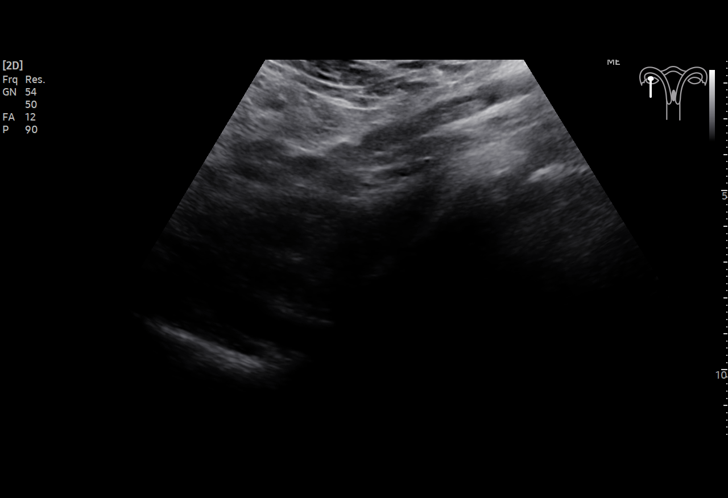
[im 53/84]
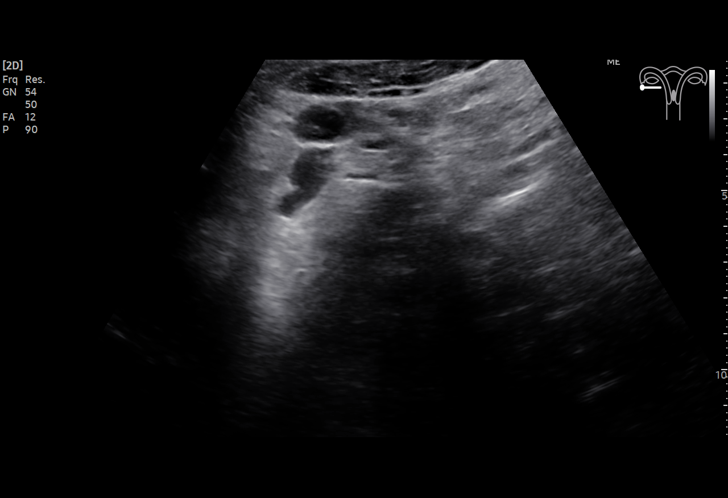
[im 59/84]
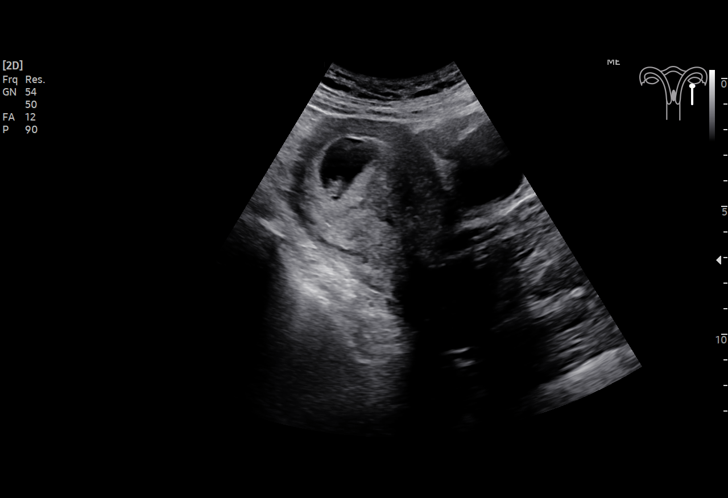
[im 65/84]
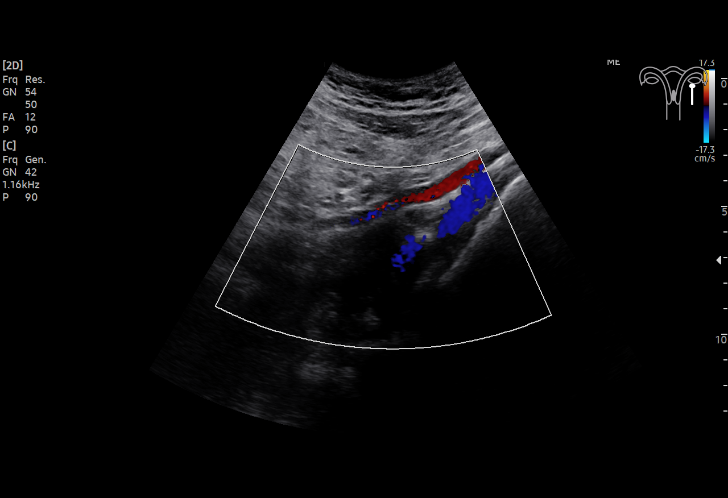
[im 71/84]
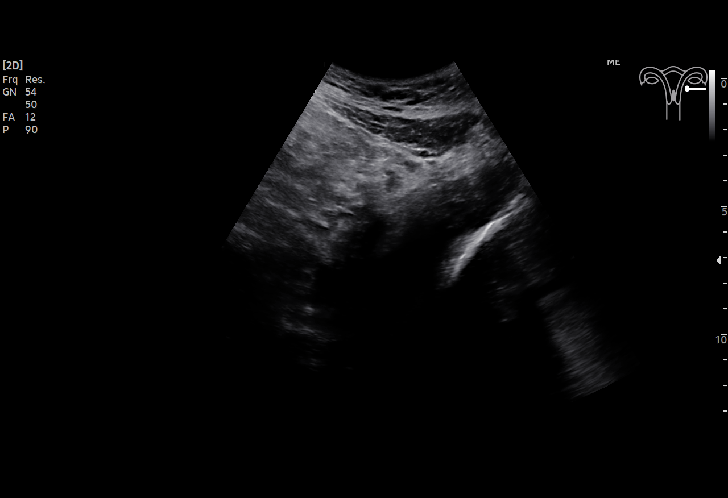
[im 77/84]
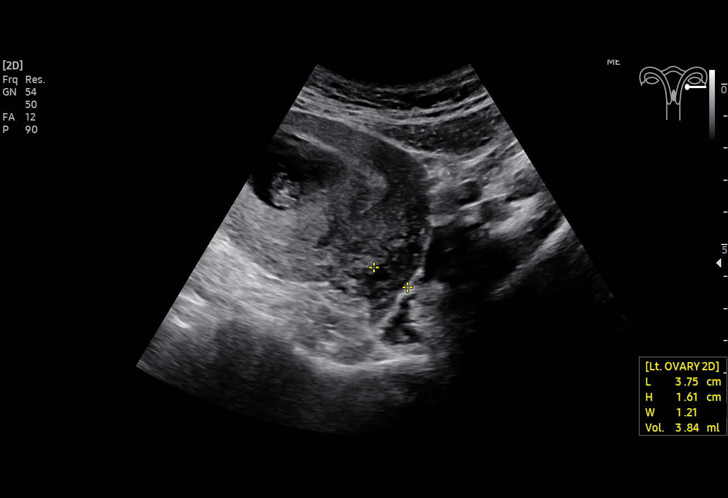
[im 84/84]
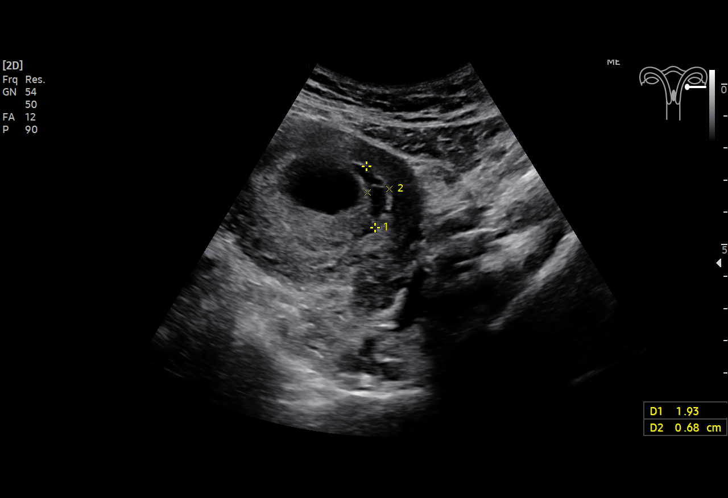

[15 of 28 positions shown; findings below may reference images not displayed]

FINDINGS: Intrauterine gestational sac: Single

Yolk sac:  Visualized.

Embryo:  Visualized.

Cardiac Activity: Visualized.

Heart Rate: 176 bpm

CRL:   20 mm   8 w 4 d                  US EDC: 03/06/2022

Subchorionic hemorrhage:  Small subchorionic hemorrhage is noted.

Maternal uterus/adnexae: Both ovaries are normal in appearance. No
evidence of mass or free fluid.
IMPRESSION: Single living IUP with estimated gestational age of 8 weeks 4 days,
and US EDC of 03/06/2022.

Small subchorionic hemorrhage.
